# Patient Record
Sex: Female | Born: 1990 | Hispanic: No | Smoking: Never smoker
Health system: Southern US, Community
[De-identification: ages and names within clinical notes are randomized; demographics above are authoritative.]

## PROBLEM LIST (undated history)

## (undated) ENCOUNTER — Inpatient Hospital Stay: Payer: Self-pay

## (undated) DIAGNOSIS — D509 Iron deficiency anemia, unspecified: Secondary | ICD-10-CM

## (undated) DIAGNOSIS — J309 Allergic rhinitis, unspecified: Secondary | ICD-10-CM

## (undated) DIAGNOSIS — N83209 Unspecified ovarian cyst, unspecified side: Secondary | ICD-10-CM

## (undated) HISTORY — PX: CHOLECYSTECTOMY: SHX55

## (undated) HISTORY — DX: Unspecified ovarian cyst, unspecified side: N83.209

## (undated) HISTORY — PX: OTHER SURGICAL HISTORY: SHX169

---
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
## 2009-12-25 ENCOUNTER — Emergency Department: Payer: Self-pay | Admitting: Emergency Medicine

## 2010-01-29 ENCOUNTER — Emergency Department: Payer: Self-pay | Admitting: Emergency Medicine

## 2010-06-26 ENCOUNTER — Ambulatory Visit: Payer: Self-pay | Admitting: Family Medicine

## 2010-08-06 ENCOUNTER — Observation Stay: Payer: Self-pay | Admitting: Obstetrics and Gynecology

## 2010-08-13 ENCOUNTER — Observation Stay: Payer: Self-pay | Admitting: Obstetrics and Gynecology

## 2010-09-01 ENCOUNTER — Inpatient Hospital Stay: Payer: Self-pay | Admitting: Obstetrics and Gynecology

## 2011-03-19 ENCOUNTER — Encounter (HOSPITAL_COMMUNITY): Payer: Self-pay | Admitting: Radiology

## 2011-03-19 ENCOUNTER — Emergency Department (HOSPITAL_COMMUNITY): Payer: No Typology Code available for payment source

## 2011-03-19 ENCOUNTER — Emergency Department: Payer: Self-pay | Admitting: Emergency Medicine

## 2011-03-19 ENCOUNTER — Observation Stay (HOSPITAL_COMMUNITY)
Admission: EM | Admit: 2011-03-19 | Discharge: 2011-03-21 | Disposition: A | Discharge status: Home / Self Care | Payer: No Typology Code available for payment source | Attending: General Surgery | Admitting: General Surgery

## 2011-03-19 DIAGNOSIS — S2020XA Contusion of thorax, unspecified, initial encounter: Secondary | ICD-10-CM | POA: Insufficient documentation

## 2011-03-19 DIAGNOSIS — R51 Headache: Secondary | ICD-10-CM | POA: Insufficient documentation

## 2011-03-19 DIAGNOSIS — Y92410 Unspecified street and highway as the place of occurrence of the external cause: Secondary | ICD-10-CM | POA: Insufficient documentation

## 2011-03-19 DIAGNOSIS — R0989 Other specified symptoms and signs involving the circulatory and respiratory systems: Secondary | ICD-10-CM | POA: Insufficient documentation

## 2011-03-19 DIAGNOSIS — J45909 Unspecified asthma, uncomplicated: Secondary | ICD-10-CM | POA: Insufficient documentation

## 2011-03-19 DIAGNOSIS — R109 Unspecified abdominal pain: Secondary | ICD-10-CM | POA: Insufficient documentation

## 2011-03-19 DIAGNOSIS — M542 Cervicalgia: Secondary | ICD-10-CM | POA: Insufficient documentation

## 2011-03-19 DIAGNOSIS — S7000XA Contusion of unspecified hip, initial encounter: Principal | ICD-10-CM | POA: Insufficient documentation

## 2011-03-19 DIAGNOSIS — R079 Chest pain, unspecified: Secondary | ICD-10-CM | POA: Insufficient documentation

## 2011-03-19 DIAGNOSIS — R0609 Other forms of dyspnea: Secondary | ICD-10-CM | POA: Insufficient documentation

## 2011-03-19 DIAGNOSIS — G930 Cerebral cysts: Secondary | ICD-10-CM | POA: Insufficient documentation

## 2011-03-19 LAB — COMPREHENSIVE METABOLIC PANEL
ALT: 50 U/L — ABNORMAL HIGH (ref 0–35)
Alkaline Phosphatase: 81 U/L (ref 39–117)
CO2: 21 mEq/L (ref 19–32)
Chloride: 106 mEq/L (ref 96–112)
GFR calc non Af Amer: >60 mL/min (ref >60)
Glucose, Bld: 102 mg/dL — ABNORMAL HIGH (ref 70–99)
Potassium: 3.5 mEq/L (ref 3.5–5.1)
Sodium: 139 mEq/L (ref 135–145)
Total Bilirubin: 0.3 mg/dL (ref 0.3–1.2)

## 2011-03-19 LAB — CBC
HCT: 40 % (ref 36.0–46.0)
MCHC: 33 g/dL (ref 30.0–36.0)
MCV: 80.5 fL (ref 78.0–100.0)
RDW: 13.6 % (ref 11.5–15.5)

## 2011-03-19 LAB — ABO/RH: ABO/RH(D): O POS

## 2011-03-19 MED ORDER — IOHEXOL 300 MG/ML  SOLN
80.0000 mL | Freq: Once | INTRAMUSCULAR | Status: AC | PRN
  Administered 2011-03-19: 80 mL via INTRAVENOUS

## 2011-03-20 ENCOUNTER — Observation Stay (HOSPITAL_COMMUNITY): Payer: No Typology Code available for payment source

## 2011-03-20 LAB — CBC
MCH: 26.8 pg (ref 26.0–34.0)
MCV: 81.1 fL (ref 78.0–100.0)
Platelets: 306 10*3/uL (ref 150–400)
RBC: 4.55 MIL/uL (ref 3.87–5.11)
RDW: 13.8 % (ref 11.5–15.5)

## 2011-03-20 LAB — BASIC METABOLIC PANEL
BUN: 8 mg/dL (ref 6–23)
Chloride: 106 mEq/L (ref 96–112)
Creatinine, Ser: 0.5 mg/dL (ref 0.4–1.2)

## 2011-03-21 LAB — BASIC METABOLIC PANEL
CO2: 23 mEq/L (ref 19–32)
Calcium: 9.1 mg/dL (ref 8.4–10.5)
Creatinine, Ser: <0.47 mg/dL (ref 0.4–1.2)
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
## 2011-07-01 ENCOUNTER — Emergency Department: Payer: Self-pay | Admitting: Emergency Medicine

## 2011-07-06 ENCOUNTER — Telehealth (INDEPENDENT_AMBULATORY_CARE_PROVIDER_SITE_OTHER): Payer: Self-pay | Admitting: Orthopedic Surgery
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
## 2012-11-21 ENCOUNTER — Emergency Department: Payer: Self-pay | Admitting: Emergency Medicine

## 2012-11-21 LAB — CBC
HGB: 14 g/dL (ref 12.0–16.0)
MCH: 28.9 pg (ref 26.0–34.0)
MCHC: 34.9 g/dL (ref 32.0–36.0)
MCV: 83 fL (ref 80–100)
Platelet: 344 10*3/uL (ref 150–440)
RBC: 4.83 10*6/uL (ref 3.80–5.20)
RDW: 14.2 % (ref 11.5–14.5)
WBC: 16.9 10*3/uL — ABNORMAL HIGH (ref 3.6–11.0)

## 2012-11-21 LAB — COMPREHENSIVE METABOLIC PANEL
Albumin: 4 g/dL (ref 3.4–5.0)
Anion Gap: 7 (ref 7–16)
BUN: 9 mg/dL (ref 7–18)
Creatinine: 0.65 mg/dL (ref 0.60–1.30)
EGFR (African American): >60
EGFR (Non-African Amer.): >60
Glucose: 93 mg/dL (ref 65–99)
Osmolality: 274 (ref 275–301)
Potassium: 3.4 mmol/L — ABNORMAL LOW (ref 3.5–5.1)
SGPT (ALT): 28 U/L (ref 12–78)
Sodium: 138 mmol/L (ref 136–145)
Total Protein: 8.2 g/dL (ref 6.4–8.2)

## 2012-11-21 LAB — URINALYSIS, COMPLETE
Bacteria: NONE SEEN
Bilirubin,UR: NEGATIVE
Glucose,UR: NEGATIVE mg/dL (ref 0–75)
Protein: NEGATIVE
RBC,UR: 3 /HPF (ref 0–5)
Specific Gravity: 1.028 (ref 1.003–1.030)
Squamous Epithelial: 66
WBC UR: 16 /HPF (ref 0–5)

## 2012-11-21 LAB — LIPASE, BLOOD: Lipase: 107 U/L (ref 73–393)

## 2013-02-04 ENCOUNTER — Emergency Department: Payer: Self-pay | Admitting: Emergency Medicine

## 2013-02-04 LAB — URINALYSIS, COMPLETE
Glucose,UR: NEGATIVE mg/dL (ref 0–75)
Nitrite: NEGATIVE
Ph: 5 (ref 4.5–8.0)
Protein: NEGATIVE
Specific Gravity: 1.02 (ref 1.003–1.030)

## 2013-02-04 LAB — COMPREHENSIVE METABOLIC PANEL
Alkaline Phosphatase: 83 U/L (ref 50–136)
Anion Gap: 3 — ABNORMAL LOW (ref 7–16)
BUN: 10 mg/dL (ref 7–18)
Bilirubin,Total: 0.3 mg/dL (ref 0.2–1.0)
Chloride: 107 mmol/L (ref 98–107)
Co2: 29 mmol/L (ref 21–32)
Creatinine: 0.84 mg/dL (ref 0.60–1.30)
Glucose: 93 mg/dL (ref 65–99)
Osmolality: 276 (ref 275–301)
Potassium: 3.4 mmol/L — ABNORMAL LOW (ref 3.5–5.1)
SGOT(AST): 19 U/L (ref 15–37)
SGPT (ALT): 25 U/L (ref 12–78)
Sodium: 139 mmol/L (ref 136–145)

## 2013-02-04 LAB — CBC
HCT: 37.2 % (ref 35.0–47.0)
RBC: 4.52 10*6/uL (ref 3.80–5.20)
RDW: 13.5 % (ref 11.5–14.5)

## 2013-02-04 LAB — PREGNANCY, URINE: Pregnancy Test, Urine: NEGATIVE m[IU]/mL

## 2013-04-23 ENCOUNTER — Emergency Department: Payer: Self-pay | Admitting: Emergency Medicine

## 2013-06-12 ENCOUNTER — Emergency Department: Payer: Self-pay | Admitting: Emergency Medicine

## 2013-06-12 LAB — BASIC METABOLIC PANEL
BUN: 11 mg/dL (ref 7–18)
Chloride: 104 mmol/L (ref 98–107)
Creatinine: 0.63 mg/dL (ref 0.60–1.30)
EGFR (African American): >60
Glucose: 92 mg/dL (ref 65–99)
Osmolality: 271 (ref 275–301)
Sodium: 136 mmol/L (ref 136–145)

## 2013-06-12 LAB — WET PREP, GENITAL

## 2013-06-12 LAB — URINALYSIS, COMPLETE
Bilirubin,UR: NEGATIVE
Blood: NEGATIVE
Ketone: NEGATIVE
Nitrite: NEGATIVE
Protein: NEGATIVE
RBC,UR: 1 /HPF (ref 0–5)
Specific Gravity: 1.028 (ref 1.003–1.030)
WBC UR: 2 /HPF (ref 0–5)

## 2013-06-12 LAB — CBC
MCV: 81 fL (ref 80–100)
Platelet: 396 10*3/uL (ref 150–440)
RBC: 4.65 10*6/uL (ref 3.80–5.20)

## 2013-06-12 LAB — HCG, QUANTITATIVE, PREGNANCY: Beta Hcg, Quant.: 12704 m[IU]/mL — ABNORMAL HIGH

## 2013-07-21 ENCOUNTER — Emergency Department: Payer: Self-pay | Admitting: Emergency Medicine

## 2013-07-21 LAB — URINALYSIS, COMPLETE
Glucose,UR: NEGATIVE mg/dL (ref 0–75)
Nitrite: NEGATIVE
Ph: 5 (ref 4.5–8.0)
Protein: 30
RBC,UR: 3 /HPF (ref 0–5)
Squamous Epithelial: 7
WBC UR: 5 /HPF (ref 0–5)

## 2013-07-21 LAB — BASIC METABOLIC PANEL
Chloride: 105 mmol/L (ref 98–107)
Co2: 25 mmol/L (ref 21–32)
Creatinine: 0.59 mg/dL — ABNORMAL LOW (ref 0.60–1.30)
EGFR (Non-African Amer.): >60
Glucose: 81 mg/dL (ref 65–99)
Osmolality: 269 (ref 275–301)

## 2013-07-21 LAB — CBC
MCH: 27.6 pg (ref 26.0–34.0)
MCV: 81 fL (ref 80–100)
RDW: 14 % (ref 11.5–14.5)

## 2013-07-21 LAB — HCG, QUANTITATIVE, PREGNANCY: Beta Hcg, Quant.: 82932 m[IU]/mL — ABNORMAL HIGH

## 2013-07-22 LAB — WET PREP, GENITAL

## 2013-08-09 ENCOUNTER — Emergency Department: Payer: Self-pay | Admitting: Emergency Medicine

## 2013-08-09 LAB — COMPREHENSIVE METABOLIC PANEL
Alkaline Phosphatase: 54 U/L (ref 50–136)
BUN: 3 mg/dL — ABNORMAL LOW (ref 7–18)
Chloride: 103 mmol/L (ref 98–107)
Co2: 26 mmol/L (ref 21–32)
SGPT (ALT): 15 U/L (ref 12–78)
Sodium: 134 mmol/L — ABNORMAL LOW (ref 136–145)
Total Protein: 7.1 g/dL (ref 6.4–8.2)

## 2013-08-09 LAB — URINALYSIS, COMPLETE
Bilirubin,UR: NEGATIVE
Blood: NEGATIVE
Glucose,UR: NEGATIVE mg/dL (ref 0–75)
Nitrite: NEGATIVE
Ph: 5 (ref 4.5–8.0)
Protein: NEGATIVE
Specific Gravity: 1.018 (ref 1.003–1.030)
Squamous Epithelial: 33
WBC UR: 15 /HPF (ref 0–5)

## 2013-08-09 LAB — CBC
HGB: 12.1 g/dL (ref 12.0–16.0)
MCH: 28 pg (ref 26.0–34.0)
RDW: 14 % (ref 11.5–14.5)

## 2013-08-09 LAB — LIPASE, BLOOD: Lipase: 111 U/L (ref 73–393)

## 2013-08-10 LAB — WET PREP, GENITAL

## 2013-08-11 LAB — URINE CULTURE

## 2013-08-27 ENCOUNTER — Encounter: Payer: Self-pay | Admitting: Family Medicine

## 2013-09-15 ENCOUNTER — Encounter: Payer: Self-pay | Admitting: Family Medicine

## 2013-11-12 ENCOUNTER — Observation Stay: Payer: Self-pay

## 2013-12-08 ENCOUNTER — Observation Stay: Payer: Self-pay

## 2013-12-08 LAB — COMPREHENSIVE METABOLIC PANEL
ALBUMIN: 2.4 g/dL — AB (ref 3.4–5.0)
ALK PHOS: 104 U/L
Anion Gap: 5 — ABNORMAL LOW (ref 7–16)
BUN: 7 mg/dL (ref 7–18)
Bilirubin,Total: 0.2 mg/dL (ref 0.2–1.0)
CREATININE: 0.48 mg/dL — AB (ref 0.60–1.30)
Calcium, Total: 8.1 mg/dL — ABNORMAL LOW (ref 8.5–10.1)
Chloride: 105 mmol/L (ref 98–107)
Co2: 25 mmol/L (ref 21–32)
EGFR (African American): >60
EGFR (Non-African Amer.): >60
GLUCOSE: 83 mg/dL (ref 65–99)
OSMOLALITY: 267 (ref 275–301)
Potassium: 3.1 mmol/L — ABNORMAL LOW (ref 3.5–5.1)
SGOT(AST): 16 U/L (ref 15–37)
SGPT (ALT): 12 U/L (ref 12–78)
SODIUM: 135 mmol/L — AB (ref 136–145)
Total Protein: 6.2 g/dL — ABNORMAL LOW (ref 6.4–8.2)

## 2013-12-08 LAB — CBC WITH DIFFERENTIAL/PLATELET
BASOS ABS: 0.1 10*3/uL (ref 0.0–0.1)
Basophil %: 0.7 %
EOS PCT: 4 %
Eosinophil #: 0.4 10*3/uL (ref 0.0–0.7)
HCT: 31.3 % — ABNORMAL LOW (ref 35.0–47.0)
HGB: 10.4 g/dL — ABNORMAL LOW (ref 12.0–16.0)
LYMPHS ABS: 2.1 10*3/uL (ref 1.0–3.6)
LYMPHS PCT: 20.5 %
MCH: 27.5 pg (ref 26.0–34.0)
MCHC: 33.3 g/dL (ref 32.0–36.0)
MCV: 83 fL (ref 80–100)
MONO ABS: 1 x10 3/mm — AB (ref 0.2–0.9)
MONOS PCT: 9.6 %
Neutrophil #: 6.6 10*3/uL — ABNORMAL HIGH (ref 1.4–6.5)
Neutrophil %: 65.2 %
Platelet: 312 10*3/uL (ref 150–440)
RBC: 3.78 10*6/uL — AB (ref 3.80–5.20)
RDW: 13.6 % (ref 11.5–14.5)
WBC: 10.1 10*3/uL (ref 3.6–11.0)

## 2013-12-08 LAB — LIPASE, BLOOD: LIPASE: 149 U/L (ref 73–393)

## 2014-02-12 ENCOUNTER — Inpatient Hospital Stay: Payer: Self-pay

## 2014-02-12 LAB — CBC WITH DIFFERENTIAL/PLATELET
Basophil #: 0.1 10*3/uL (ref 0.0–0.1)
Basophil %: 0.5 %
EOS ABS: 0.4 10*3/uL (ref 0.0–0.7)
EOS PCT: 3.9 %
HCT: 34.5 % — ABNORMAL LOW (ref 35.0–47.0)
HGB: 11.2 g/dL — AB (ref 12.0–16.0)
Lymphocyte #: 1.9 10*3/uL (ref 1.0–3.6)
Lymphocyte %: 19 %
MCH: 26.9 pg (ref 26.0–34.0)
MCHC: 32.6 g/dL (ref 32.0–36.0)
MCV: 83 fL (ref 80–100)
Monocyte #: 0.8 x10 3/mm (ref 0.2–0.9)
Monocyte %: 8 %
NEUTROS ABS: 6.9 10*3/uL — AB (ref 1.4–6.5)
Neutrophil %: 68.6 %
Platelet: 273 10*3/uL (ref 150–440)
RBC: 4.18 10*6/uL (ref 3.80–5.20)
RDW: 16.1 % — AB (ref 11.5–14.5)
WBC: 10 10*3/uL (ref 3.6–11.0)

## 2014-02-13 LAB — GC/CHLAMYDIA PROBE AMP

## 2014-02-14 LAB — HEMATOCRIT: HCT: 31.2 % — ABNORMAL LOW (ref 35.0–47.0)

## 2014-05-10 ENCOUNTER — Emergency Department: Payer: Self-pay | Admitting: Emergency Medicine

## 2014-08-06 ENCOUNTER — Emergency Department: Payer: Self-pay | Admitting: Emergency Medicine

## 2014-08-06 LAB — URINALYSIS, COMPLETE
Bilirubin,UR: NEGATIVE
Blood: NEGATIVE
GLUCOSE, UR: NEGATIVE mg/dL (ref 0–75)
KETONE: NEGATIVE
Nitrite: NEGATIVE
Ph: 5 (ref 4.5–8.0)
Protein: NEGATIVE
RBC,UR: 7 /HPF (ref 0–5)
Specific Gravity: 1.015 (ref 1.003–1.030)
Squamous Epithelial: <1

## 2014-08-19 ENCOUNTER — Emergency Department: Payer: Self-pay | Admitting: Emergency Medicine

## 2014-08-19 LAB — BASIC METABOLIC PANEL
ANION GAP: 6 — AB (ref 7–16)
BUN: 10 mg/dL (ref 7–18)
CREATININE: 0.63 mg/dL (ref 0.60–1.30)
Calcium, Total: 8.7 mg/dL (ref 8.5–10.1)
Chloride: 107 mmol/L (ref 98–107)
Co2: 26 mmol/L (ref 21–32)
EGFR (Non-African Amer.): >60
Glucose: 78 mg/dL (ref 65–99)
Osmolality: 275 (ref 275–301)
POTASSIUM: 3.3 mmol/L — AB (ref 3.5–5.1)
Sodium: 139 mmol/L (ref 136–145)

## 2014-08-19 LAB — CBC
HCT: 38.2 % (ref 35.0–47.0)
HGB: 12.6 g/dL (ref 12.0–16.0)
MCH: 27.8 pg (ref 26.0–34.0)
MCHC: 33.1 g/dL (ref 32.0–36.0)
MCV: 84 fL (ref 80–100)
PLATELETS: 322 10*3/uL (ref 150–440)
RBC: 4.55 10*6/uL (ref 3.80–5.20)
RDW: 13.4 % (ref 11.5–14.5)
WBC: 8.6 10*3/uL (ref 3.6–11.0)

## 2014-08-19 LAB — URINALYSIS, COMPLETE
BILIRUBIN, UR: NEGATIVE
Bacteria: NONE SEEN
Glucose,UR: NEGATIVE mg/dL (ref 0–75)
Ketone: NEGATIVE
Nitrite: NEGATIVE
Ph: 5 (ref 4.5–8.0)
Protein: NEGATIVE
RBC,UR: 13 /HPF (ref 0–5)
Specific Gravity: 1.02 (ref 1.003–1.030)
Transitional Epi: 3

## 2014-08-19 LAB — TROPONIN I

## 2014-08-19 LAB — D-DIMER(ARMC): D-Dimer: 199 ng/ml

## 2014-10-08 ENCOUNTER — Emergency Department: Payer: Self-pay | Admitting: Emergency Medicine

## 2014-10-08 LAB — URINALYSIS, COMPLETE
BILIRUBIN, UR: NEGATIVE
Blood: NEGATIVE
Glucose,UR: NEGATIVE mg/dL (ref 0–75)
Ketone: NEGATIVE
Nitrite: NEGATIVE
PROTEIN: NEGATIVE
Ph: 5 (ref 4.5–8.0)
RBC,UR: 11 /HPF (ref 0–5)
Specific Gravity: 1.028 (ref 1.003–1.030)
Squamous Epithelial: 7
WBC UR: 60 /HPF (ref 0–5)

## 2014-10-28 DIAGNOSIS — D649 Anemia, unspecified: Secondary | ICD-10-CM | POA: Insufficient documentation

## 2014-10-28 DIAGNOSIS — R0789 Other chest pain: Secondary | ICD-10-CM | POA: Insufficient documentation

## 2014-10-28 DIAGNOSIS — R0600 Dyspnea, unspecified: Secondary | ICD-10-CM | POA: Insufficient documentation

## 2014-10-28 DIAGNOSIS — Z8742 Personal history of other diseases of the female genital tract: Secondary | ICD-10-CM | POA: Insufficient documentation

## 2015-02-23 ENCOUNTER — Emergency Department
Admit: 2015-02-23 | Disposition: A | Discharge status: Home / Self Care | Payer: Self-pay | Admitting: Emergency Medicine

## 2015-02-23 LAB — URINALYSIS, COMPLETE
Bilirubin,UR: NEGATIVE
Glucose,UR: NEGATIVE mg/dL (ref 0–75)
Nitrite: NEGATIVE
PH: 5 (ref 4.5–8.0)
Protein: NEGATIVE
Specific Gravity: 1.02 (ref 1.003–1.030)

## 2015-02-23 LAB — BASIC METABOLIC PANEL
ANION GAP: 8 (ref 7–16)
BUN: 11 mg/dL
CALCIUM: 9.1 mg/dL
CREATININE: 0.57 mg/dL
Chloride: 104 mmol/L
Co2: 25 mmol/L
Glucose: 107 mg/dL — ABNORMAL HIGH
Potassium: 3.7 mmol/L
SODIUM: 137 mmol/L

## 2015-02-23 LAB — CBC
HCT: 38.4 % (ref 35.0–47.0)
HGB: 12.8 g/dL (ref 12.0–16.0)
MCH: 27.5 pg (ref 26.0–34.0)
MCHC: 33.2 g/dL (ref 32.0–36.0)
MCV: 83 fL (ref 80–100)
Platelet: 336 10*3/uL (ref 150–440)
RBC: 4.64 10*6/uL (ref 3.80–5.20)
RDW: 13.9 % (ref 11.5–14.5)
WBC: 11 10*3/uL (ref 3.6–11.0)

## 2015-03-25 NOTE — H&P (Signed)
L&D Evaluation:  History:  HPI 24 year old G3 p2002 with EDC=02/05/2013 by a 6 week ultrasound presents at 31.4 wks with c/o right sided abdominal pain. The pain was first noted in RUQ at 9 PM last night and resolved 1-2 hours later after having a BM. The pain was worse when she took a deep breath. She had just eaten a tortilla stuffed with meat an hour before the pain started. She then had the pain recur at 3:30 PM today and it has been constant since then. The pain is not accompanied by nausea and did not seen to be affected by eating supper. Denies diarrhea. C/O daily "hard" BMs sometimes accompanied by bleeding and pain at the rectum. Had chills last night with the pain. Has heartburn and takes Tums prn. The pain is also made worse by lying supine and improves with lying on her left side. Seen in L&D the end of Dec with URI sx and decreased FM. States the cold sx have resolved.  Hx of seasonal allergies and asthma. Currently not taking any antihistimines, but does use Flonase nasal spray and Albuterol prn. PNC at [facility]   Presents with abdominal pain, right sided abdominal pain   Patient's Medical History Asthma  seasonal allergies.   Patient's Surgical History Cholecystectomy 2010   Medications Pre Natal Vitamins  Tylenol (Acetaminophen)  Albuterol inhaler, Flonase nasal spray, Tums   Allergies NKDA   Social History none   Family History Non-Contributory   ROS:  ROS see HPI. Also positive for leg cramps bilaterally   Exam:  Vital Signs stable  101/46   Urine Protein not completed   General no apparent distress   Mental Status clear   Chest clear  Not tachypnic, no labored breathing   Heart normal sinus rhythm, no murmur/gallop/rubs   Abdomen gravid, baby lying on maternal right; tenderness in RUQ< tenderness over right round ligament.   Estimated Fetal Weight Average for gestational age   Fetal Position cephalic   Back no CVAT   Edema no edema   Mebranes Intact    FHT normal rate with no decels, 125 baseline with accels to 140s, mod variability   FHT Description CAT1   Ucx absent   Skin dry   Impression:  Impression reactive NST, IUP at 31 4/7 weeks with right sided abdominal pain   Plan:  Plan DC EFM. CMP, lipase, CBC   Comments WBC=10.1K, liver enzymes and lipase normal. K+=3.1. Given 20 meq KCL po and advised of foods high in K+. Patient reassured that pain most likely a result of baby lying on her right, round ligament pain, and perhaps constipation. Recommend increasing water intake. Can also take magnesium supplements. Will discharge home. FU at [facility] as scheduled in 2 weeks.   Electronic Signatures: Trinna BalloonGutierrez, Polo Mcmartin L (CNM)  (Signed 24-Jan-15 20:30)  Authored: L&D Evaluation   Last Updated: 24-Jan-15 20:30 by Trinna BalloonGutierrez, Wrenley Sayed L (CNM)

## 2015-03-25 NOTE — H&P (Signed)
L&D Evaluation:  History:  HPI 24 year old G3 p2002 with EDC=02/05/2013 by a 6 week ultrasound presents at 27.6 wks with decreased FM today.  She also c/o URI sx since 12/24-nasal/sinus congestion and pressure and mild throat irritation. Has taken one regular strength Tylenol at 1100 today without relief. Hx of seasonal allergies and asthma. Currently not taking any antihistimines, but does use Flonase nasal spray. Has been using her Albuterol inhaler for wheezing and cough that she has been experiencing mostly at night. Maybe experiencing some reflux at night also "feels like everything is inher throat when she lies down". PNC at [facility]   Presents with decreased fetal movement, URI sx.   Patient's Medical History Asthma  seasonal allergies.    Patient's Surgical History Cholecystectomy 2010    Medications Pre Natal Vitamins  Tylenol (Acetaminophen)  Albuterol inhaler, Flonase nasal spray.    Allergies NKDA   Social History none    Family History Non-Contributory    ROS:  ROS see HPI   Exam:  Vital Signs afebrile. 90/50    Urine Protein not completed   General no apparent distress   Mental Status clear    Chest clear    Heart normal sinus rhythm, no murmur/gallop/rubs   Abdomen gravid, non-tender   Edema no edema    Reflexes 1+    Mebranes Intact   FHT 120 with accels to 140s to 150 (multiple audible FMs)   FHT Description Cat 1   Ucx absent   Skin dry   Other Nasal turbinates: right boggy, bluish, swollen and left inflammed and swollen. OP: enlarged, noninflammed tonsils.   Impression:  Impression IUP at 27.6 weeks with reactive NST. URI. Possible reflux   Plan:  Plan DC home on mucinex 600 mgm and Zantac 150 mgm BID prn. FU at [facility] this week as scheduled. [facility]   Electronic Signatures: Trinna BalloonGutierrez, Wong Steadham L (CNM)  (Signed 30-Dec-14 00:32)  Authored: L&D Evaluation   Last Updated: 30-Dec-14 00:32 by Trinna BalloonGutierrez, Evadene Wardrip L (CNM)

## 2015-03-25 NOTE — H&P (Signed)
L&D Evaluation:  History Expanded:  HPI 24 yo G3P2002 at 5841w0d gestational age by 6 week ultrasound presents for Induction of labor due to late term pregnancy.  Pregnancy complicated by asthma, which she rarely need to treat.  She notes occasional contractions.  She notes positive fetal movement, no leakage of fluid, and no vaginal bleeding.  This is a little girl named Abigail.   Gravida 3   Term 2   PreTerm 0   Abortion 0   Living 2   Blood Type (Maternal) O positive   Group B Strep Results Maternal (Result >5wks must be treated as unknown) negative   Maternal HIV Negative   Maternal Syphilis Ab Nonreactive   Maternal Varicella Immune   Rubella Results (Maternal) immune   [facility]   Patient's Medical History Asthma   Patient's Surgical History laparoscopic cholecystectomy   Medications Pre Natal Vitamins  Iron   Allergies Unisom   Social History none   Family History Non-Contributory   ROS:  ROS All systems were reviewed.  HEENT, CNS, GI, GU, Respiratory, CV, Renal and Musculoskeletal systems were found to be normal.   Exam:  Vital Signs Afebrile, normotensive, all other vitals negative   General no apparent distress   Mental Status clear   Chest clear   Heart normal sinus rhythm   Abdomen gravid, non-tender   Estimated Fetal Weight 7 pounds   Fetal Position cephalic by bedside ultrasound   Back no CVAT   Edema no edema   Pelvic no external lesions, 1cm per RN   Mebranes Intact   FHT normal rate with no decels   FHT Description 130/mod var/+accels/no decels   Ucx regular, 1-2  q 10 min (roughly q 6)   Skin no lesions   Impression:  Impression 1) Intrauterine pregnancy at 7541 weeks gestational age , 2) Induction of labor for late term   Plan:  Comments 1) Labor: start with cervadil  2) Fetus - category I tracing  3) PNL O positive / ABSC negative / RI / VZI / HIV neg / RPR NR / HBsAg neg / SS negative/  1-hr OGTT 80 / GBS  negative (01/09/14)  4) TDAP given 11/16/13  5) Disposition - home postpartum  Pt has been fully informed of the pros and cons, risk/benefits continued observation with NST/AFI versus the risks of induction.  She understands that there is an increasing risk to the fetus with continued intrauterine existence after [redacted] weeks gestation.  She also understands that there are uncommon risks to induction, which include but are not limited to:  frequent and/or prolonged uterine contractions, fetal distress, uterine rupture and lack of success of induction.  These risks involve all methods of induction whether Pitocin or Misoprostol She also has been informed that if the induction is not successful a Cesarean Section may be necessary.   Electronic Signatures: Conard NovakJackson, Esmirna Ravan D (MD)  (Signed 31-Mar-15 22:48)  Authored: L&D Evaluation   Last Updated: 31-Mar-15 22:48 by Conard NovakJackson, Suraiya Dickerson D (MD)

## 2015-07-17 DIAGNOSIS — IMO0001 Reserved for inherently not codable concepts without codable children: Secondary | ICD-10-CM | POA: Insufficient documentation

## 2015-07-17 DIAGNOSIS — R209 Unspecified disturbances of skin sensation: Secondary | ICD-10-CM

## 2015-08-07 ENCOUNTER — Emergency Department
Admission: EM | Admit: 2015-08-07 | Discharge: 2015-08-08 | Disposition: A | Discharge status: Home / Self Care | Payer: Medicaid Other | Attending: Emergency Medicine | Admitting: Emergency Medicine

## 2015-08-07 ENCOUNTER — Encounter: Payer: Self-pay | Admitting: *Deleted

## 2015-08-07 DIAGNOSIS — Z3202 Encounter for pregnancy test, result negative: Secondary | ICD-10-CM | POA: Diagnosis not present

## 2015-08-07 DIAGNOSIS — N10 Acute tubulo-interstitial nephritis: Secondary | ICD-10-CM | POA: Diagnosis not present

## 2015-08-07 DIAGNOSIS — R1010 Upper abdominal pain, unspecified: Secondary | ICD-10-CM | POA: Diagnosis present

## 2015-08-07 LAB — URINALYSIS COMPLETE WITH MICROSCOPIC (ARMC ONLY)
Bilirubin Urine: NEGATIVE
Glucose, UA: NEGATIVE mg/dL
Ketones, ur: NEGATIVE mg/dL
Nitrite: NEGATIVE
PH: 6 (ref 5.0–8.0)
PROTEIN: NEGATIVE mg/dL
Specific Gravity, Urine: 1.006 (ref 1.005–1.030)

## 2015-08-07 LAB — CBC WITH DIFFERENTIAL/PLATELET
BASOS ABS: 0.1 10*3/uL (ref 0–0.1)
Basophils Relative: 1 %
Eosinophils Absolute: 0.4 10*3/uL (ref 0–0.7)
Eosinophils Relative: 4 %
HEMATOCRIT: 41 % (ref 35.0–47.0)
Hemoglobin: 13.8 g/dL (ref 12.0–16.0)
LYMPHS PCT: 23 %
Lymphs Abs: 2.6 10*3/uL (ref 1.0–3.6)
MCH: 27.8 pg (ref 26.0–34.0)
MCHC: 33.6 g/dL (ref 32.0–36.0)
MCV: 82.8 fL (ref 80.0–100.0)
MONO ABS: 0.7 10*3/uL (ref 0.2–0.9)
MONOS PCT: 6 %
NEUTROS ABS: 7.6 10*3/uL — AB (ref 1.4–6.5)
Neutrophils Relative %: 66 %
Platelets: 353 10*3/uL (ref 150–440)
RBC: 4.95 MIL/uL (ref 3.80–5.20)
RDW: 14.4 % (ref 11.5–14.5)
WBC: 11.5 10*3/uL — ABNORMAL HIGH (ref 3.6–11.0)

## 2015-08-07 LAB — BASIC METABOLIC PANEL
ANION GAP: 7 (ref 5–15)
BUN: 12 mg/dL (ref 6–20)
CALCIUM: 9.2 mg/dL (ref 8.9–10.3)
CHLORIDE: 105 mmol/L (ref 101–111)
CO2: 25 mmol/L (ref 22–32)
Creatinine, Ser: 0.72 mg/dL (ref 0.44–1.00)
GFR calc Af Amer: >60 mL/min (ref >60)
GFR calc non Af Amer: >60 mL/min (ref >60)
GLUCOSE: 94 mg/dL (ref 65–99)
Potassium: 3.9 mmol/L (ref 3.5–5.1)
Sodium: 137 mmol/L (ref 135–145)

## 2015-08-07 LAB — POCT PREGNANCY, URINE: PREG TEST UR: NEGATIVE

## 2015-08-07 NOTE — ED Notes (Signed)
poct pregnancy Negative 

## 2015-08-07 NOTE — ED Notes (Signed)
Pt has upper abd pain.  States goes into mid back.  No n/v/d.  Pain began this morning and worsened tonight.   Took otc meds without relief today.

## 2015-08-08 MED ORDER — CIPROFLOXACIN HCL 500 MG PO TABS
500.0000 mg | ORAL_TABLET | Freq: Once | ORAL | Status: AC
  Administered 2015-08-08: 500 mg via ORAL
  Filled 2015-08-08: qty 1

## 2015-08-08 MED ORDER — CIPROFLOXACIN HCL 500 MG PO TABS: 500.0000 mg | ORAL_TABLET | Freq: Two times a day (BID) | ORAL | Status: DC

## 2015-08-08 NOTE — ED Provider Notes (Signed)
[facility] Emergency Department Provider Note  ____________________________________________  Time seen: 12:15 AM  I have reviewed the triage vital signs and the nursing notes.   HISTORY  Chief Complaint Abdominal Pain      HPI Courtney Sanders is a 24 y.o. female presents with upper abdominal pain radiating to her back, dysuria and nausea. Patient denies any fever. Attempt on arrival to the emergency department 99.2 oral. Current pain score 5 out of 10    Past medical history None There are no active problems to display for this patient.   Past surgical history Cholecystectomy No current outpatient prescriptions on file.  Allergies No known drug allergies No family history on file.  Social History Social History  Substance Use Topics  . Smoking status: Never Smoker   . Smokeless tobacco: Not on file  . Alcohol Use: No    Review of Systems  Constitutional: Negative for fever. Eyes: Negative for visual changes. ENT: Negative for sore throat. Cardiovascular: Negative for chest pain. Respiratory: Negative for shortness of breath. Gastrointestinal: Positive for abdominal pain, negative for vomiting and diarrhea. Genitourinary: Negative for dysuria. Musculoskeletal: Negative for back pain. Skin: Negative for rash. Neurological: Negative for headaches, focal weakness or numbness.   10-point ROS otherwise negative.  ____________________________________________   PHYSICAL EXAM:  VITAL SIGNS: ED Triage Vitals  Enc Vitals Group     BP 08/07/15 2046 118/66 mmHg     Pulse Rate 08/07/15 2046 84     Resp 08/07/15 2046 18     Temp 08/07/15 2046 99.2 F (37.3 C)     Temp Source 08/07/15 2046 Oral     SpO2 08/07/15 2046 98 %     Weight 08/07/15 2046 190 lb (86.183 kg)     Height 08/07/15 2046  (1.575 m)     Head Cir --      Peak Flow --      Pain Score 08/07/15 2047 10     Pain Loc --      Pain Edu? --    Excl. in GC? --      Constitutional: Alert and oriented. Well appearing and in no distress. Eyes: Conjunctivae are normal. PERRL. Normal extraocular movements. ENT   Head: Normocephalic and atraumatic.   Nose: No congestion/rhinnorhea.   Mouth/Throat: Mucous membranes are moist.   Neck: No stridor. Cardiovascular: Normal rate, regular rhythm. Normal and symmetric distal pulses are present in all extremities. No murmurs, rubs, or gallops. Respiratory: Normal respiratory effort without tachypnea nor retractions. Breath sounds are clear and equal bilaterally. No wheezes/rales/rhonchi. Gastrointestinal: Soft and nontender. No distention. Left CVA tenderness Genitourinary: deferred Musculoskeletal: Nontender with normal range of motion in all extremities. No joint effusions.  No lower extremity tenderness nor edema. Neurologic:  Normal speech and language. No gross focal neurologic deficits are appreciated. Speech is normal.  Skin:  Skin is warm, dry and intact. No rash noted. Psychiatric: Mood and affect are normal. Speech and behavior are normal. Patient exhibits appropriate insight and judgment.  ____________________________________________    LABS (pertinent positives/negatives)  Labs Reviewed  CBC WITH DIFFERENTIAL/PLATELET - Abnormal; Notable for the following:    WBC 11.5 (*)    Neutro Abs 7.6 (*)    All other components within normal limits  URINALYSIS COMPLETEWITH MICROSCOPIC ([facility] ONLY) - Abnormal; Notable for the following:    Color, Urine STRAW (*)    APPearance HAZY (*)    Hgb urine dipstick 2+ (*)    Leukocytes, UA  3+ (*)    Bacteria, UA RARE (*)    Squamous Epithelial / LPF 0-5 (*)    All other components within normal limits  BASIC METABOLIC PANEL  POC URINE PREG, ED  POCT PREGNANCY, URINE       INITIAL IMPRESSION / ASSESSMENT AND PLAN / ED COURSE  Pertinent labs & imaging results that were available during my care of the patient were reviewed  by me and considered in my medical decision making (see chart for details).  History physical exam, laboratory data consistent with acute pyelonephritis.  ____________________________________________   FINAL CLINICAL IMPRESSION(S) / ED DIAGNOSES  Final diagnoses:  Acute pyelonephritis      Darci Current, MD 08/08/15 [phone]

## 2015-08-08 NOTE — Discharge Instructions (Signed)
Pielonefritis - Adultos  °(Pyelonephritis, Adult) ° La pielonefritis es una infección del riñón. Hay dos tipos principales de pielonefritis:  °· Una infección que se inicia rápidamente sin síntomas previos (pielonefritis aguda). °· Infecciones que persisten por un largo período (pielonefritis crónica). °CAUSAS  °Hay dos causas principales:  °· Pasaje de bacterias desde la vejiga al riñón. Este problema aparece especialmente en mujeres embarazadas. La orina en la vejiga puede infectarse por diferentes causas, por ejemplo: °¨ Inflamación de la próstata (prostatitis). °¨ Durante las relaciones sexuales en las mujeres. °¨ Infección en la vejiga (cistitis). °· Pasaje de bacterias desde la sangre hacia el riñón. °Las enfermedades que aumentan el riesgo son:  °· Diabetes. °· Cálculos renales o en la vesícula. °· Cáncer. °· Un catéter colocado en la vejiga. °· Otras anormalidades del riñón o de la uretra. °SÍNTOMAS  °· Dolor abdominal °· Dolor en la zona del costado o flanco. °· Fiebre. °· Escalofríos. °· Malestar estomacal. °· Sangre en la orina (orina oscura). °· Necesidad frecuente de orinar °· Necesidad intensa o persistente de orinar. °· Sensación de ardor o pinchazos al orinar. °DIAGNÓSTICO  °El médico diagnosticará una infección en su riñón basándose en los síntomas. También tomará una muestra de orina.  °TRATAMIENTO  °Generalmente el tratamiento depende de la gravedad de la infección.  °· Si la infección es leve y se diagnostica a tiempo, el médico lo tratará con antibióticos por vía oral y lo dejará irse a su casa. °· Si la infección es más grave, la bacteria podría haber ingresado al torrente sanguíneo. Esto requerirá antibióticos por vía intravenosa y la permanencia en el hospital. Los síntomas pueden incluir: °¨ Fiebre alta. °¨ Dolor intenso en un costado del cuerpo. °¨ Escalofríos °· Aún después de haber permanecido en el hospital, el médico podrá indicarle antibióticos por vía oral durante cierto período de  tiempo. °· Podrá prescribirle otros tratamientos según la causa de la infección. °INSTRUCCIONES PARA EL CUIDADO EN EL HOGAR  °· Tome los antibióticos como se le indicó. Tómelos todos, aunque se sienta mejor. °· Concurra para realizar un control y asegurarse de que la infección ha desaparecido. °· Debe ingerir gran cantidad de líquido para mantener la orina de tono claro o color amarillo pálido. °· Tome medicamentos para la vejiga si siente urgencia para orinar o lo hace con mucha frecuencia. °SOLICITE ATENCIÓN MÉDICA DE INMEDIATO SI:  °· Tiene fiebre o síntomas persistentes durante más de 2 ó 3 días. °· Tiene fiebre y los síntomas empeoran. °· No puede tomar los antibióticos ni ingerir líquidos. °· Comienza a sentir escalofríos. °· Siente debilidad extrema o se desmaya. °· No mejora después de 2 días de tratamiento. °ASEGÚRESE DE QUE:  °· Comprende estas instrucciones. °· Controlará su enfermedad. °· Solicitará ayuda de inmediato si no mejora o empeora. °Document Released: 08/11/2005 Document Revised: 05/02/2012 °Exit[employer]® Patient Information ©2015 [employer]. This information is not intended to replace advice given to you by your health care provider. Make sure you discuss any questions you have with your health care provider. ° °

## 2015-12-23 ENCOUNTER — Emergency Department
Admission: EM | Admit: 2015-12-23 | Discharge: 2015-12-23 | Disposition: A | Discharge status: Home / Self Care | Payer: Medicaid Other | Attending: Emergency Medicine | Admitting: Emergency Medicine

## 2015-12-23 ENCOUNTER — Encounter: Payer: Self-pay | Admitting: Emergency Medicine

## 2015-12-23 DIAGNOSIS — R109 Unspecified abdominal pain: Secondary | ICD-10-CM | POA: Insufficient documentation

## 2015-12-23 DIAGNOSIS — R1110 Vomiting, unspecified: Secondary | ICD-10-CM | POA: Insufficient documentation

## 2015-12-23 NOTE — ED Notes (Signed)
Pt to ed with c/o vomiting x 4 today, denies diarrhea.  Pt also reports abd pain.

## 2016-03-10 ENCOUNTER — Encounter: Payer: Self-pay | Admitting: Emergency Medicine

## 2016-03-10 ENCOUNTER — Emergency Department
Admission: EM | Admit: 2016-03-10 | Discharge: 2016-03-11 | Disposition: A | Discharge status: Home / Self Care | Payer: Medicaid Other | Attending: Emergency Medicine | Admitting: Emergency Medicine

## 2016-03-10 DIAGNOSIS — M5442 Lumbago with sciatica, left side: Secondary | ICD-10-CM | POA: Diagnosis not present

## 2016-03-10 DIAGNOSIS — J45909 Unspecified asthma, uncomplicated: Secondary | ICD-10-CM | POA: Insufficient documentation

## 2016-03-10 DIAGNOSIS — Z79899 Other long term (current) drug therapy: Secondary | ICD-10-CM | POA: Insufficient documentation

## 2016-03-10 DIAGNOSIS — R1032 Left lower quadrant pain: Secondary | ICD-10-CM | POA: Insufficient documentation

## 2016-03-10 LAB — URINALYSIS COMPLETE WITH MICROSCOPIC (ARMC ONLY)
Bacteria, UA: NONE SEEN
Bilirubin Urine: NEGATIVE
Glucose, UA: NEGATIVE mg/dL
KETONES UR: NEGATIVE mg/dL
LEUKOCYTES UA: NEGATIVE
Nitrite: NEGATIVE
PH: 6 (ref 5.0–8.0)
PROTEIN: NEGATIVE mg/dL
Specific Gravity, Urine: 1.005 (ref 1.005–1.030)

## 2016-03-10 MED ORDER — ONDANSETRON HCL 4 MG/2ML IJ SOLN
4.0000 mg | Freq: Once | INTRAMUSCULAR | Status: AC
  Administered 2016-03-10: 4 mg via INTRAVENOUS
  Filled 2016-03-10: qty 2

## 2016-03-10 MED ORDER — MORPHINE SULFATE (PF) 4 MG/ML IV SOLN
4.0000 mg | Freq: Once | INTRAVENOUS | Status: AC
  Administered 2016-03-10: 4 mg via INTRAVENOUS
  Filled 2016-03-10: qty 1

## 2016-03-10 NOTE — ED Notes (Signed)
Patient ambulatory to triage with steady gait, without difficulty or distress noted; pt reports left side abd pain since yesterday accomp by pelvic pressure

## 2016-03-10 NOTE — ED Notes (Signed)
Pt c/o left lower side pain for the last 24 hours  - the pain radiates all the way down leg and into foot - pt reports that she has pain in her "bladder when she is sitting" - pt c/o nausea but denies vomiting - reports bloated

## 2016-03-10 NOTE — ED Provider Notes (Signed)
[facility] Emergency Department Provider Note  ____________________________________________  Time seen: Approximately 11:05 PM  I have reviewed the triage vital signs and the nursing notes.   HISTORY  Chief Complaint Abdominal Pain    HPI Courtney Sanders is a 25 y.o. female with a past medical history includes only mild well-controlled asthma and obesity who presents with acute onset of gradually worsening bilateral lower back pain that is worse on the left side and radiating all the way down to her foot.  She reports the pain is constant and severe, worse with sitting in certain positions, better with nothing.  She describes as an aching and sometimes sharp and stabbing pain.  It is accompanied with a sensation of numbness and tingling around in her hip and pelvic region and down the back of her left leg.  She states that she also feels a sensation of pressure in her bladder and feels that she has not been able to fully empty her bladder.  She also has been noting some urinary incontinence and when she took off her clothes here in the emergency department she noted that her underwear had some urine in it.She denies any trauma or injury.  She does not work out extensively and has not been lifting heavy objects.  She has not had any bowel symptoms including no diarrhea nor bowel incontinence.  She denies fever/chills, chest pain, shortness of breath, upper abdominal pain, dysuria, headache.  She has some left sided lower abdominal pain but it seems to be coming from her back.   Past Medical History  Diagnosis Date  . Asthma     There are no active problems to display for this patient.   Past Surgical History  Procedure Laterality Date  . Cholecystectomy      Current Outpatient Rx  Name  Route  Sig  Dispense  Refill  . albuterol (PROVENTIL HFA;VENTOLIN HFA) 108 (90 Base) MCG/ACT inhaler   Inhalation   Inhale 2 puffs into the lungs 2  (two) times daily.         . cetirizine (ZYRTEC) 10 MG tablet   Oral   Take 10 mg by mouth daily.         Marland Kitchen ibuprofen (ADVIL,MOTRIN) 200 MG tablet   Oral   Take 400 mg by mouth every 6 (six) hours as needed.         Marland Kitchen HYDROcodone-acetaminophen (NORCO/VICODIN) 5-325 MG tablet   Oral   Take 1-2 tablets by mouth every 4 (four) hours as needed for moderate pain.   15 tablet   0   . ondansetron (ZOFRAN) 4 MG tablet      Take 1-2 tabs by mouth every 8 hours as needed for nausea/vomiting   30 tablet   0     Allergies Unisom  No family history on file.  Social History Social History  Substance Use Topics  . Smoking status: Never Smoker   . Smokeless tobacco: None  . Alcohol Use: Yes    Review of Systems Constitutional: No fever/chills Eyes: No visual changes. ENT: No sore throat. Cardiovascular: Denies chest pain. Respiratory: Denies shortness of breath. Gastrointestinal: No abdominal pain.  No nausea, no vomiting.  No diarrhea.  No constipation. Genitourinary: Negative for dysuria. Sensation of inability to fully empty bladder and bladder pressure.  Some urinary incontinence. Musculoskeletal: Negative for back pain. Skin: Negative for rash. Neurological: Negative for headaches.  Numbness/tingling around L hip and down LLE.  No weakness  10-point ROS otherwise negative.  ____________________________________________   PHYSICAL EXAM:  VITAL SIGNS: ED Triage Vitals  Enc Vitals Group     BP 03/10/16 2228 120/80 mmHg     Pulse Rate 03/10/16 2228 72     Resp 03/10/16 2228 20     Temp 03/10/16 2228 98.1 F (36.7 C)     Temp Source 03/10/16 2228 Oral     SpO2 03/10/16 2228 99 %     Weight 03/10/16 2228 200 lb (90.719 kg)     Height 03/10/16 2228  (1.575 m)     Head Cir --      Peak Flow --      Pain Score 03/10/16 2227 10     Pain Loc --      Pain Edu? --      Excl. in GC? --     Constitutional: Alert and oriented. Well appearing and in appears  to be in pain Eyes: Conjunctivae are normal. PERRL. EOMI. Head: Atraumatic. Nose: No congestion/rhinnorhea. Mouth/Throat: Mucous membranes are moist.  Oropharynx non-erythematous. Neck: No stridor.  No meningeal signs.   Cardiovascular: Normal rate, regular rhythm. Good peripheral circulation. Grossly normal heart sounds.   Respiratory: Normal respiratory effort.  No retractions. Lungs CTAB. Gastrointestinal: Soft With very mild left lower quadrant tenderness that seems to be coming around from the side or back. No distention. Deferred rectal. Musculoskeletal: No lower extremity tenderness nor edema. No gross deformities of extremities.  Severe tenderness to palpation of bilateral lower back (L-spine), L hip.  Reproducible pain with ROM of L hip. Neurologic:  Normal speech and language. No gross focal neurologic deficits are appreciated but patient has subjective decreased light touch sensation on posterior LLE and endorses tingling/numbness on L hip. Skin:  Skin is warm, dry and intact. No rash noted. Psychiatric: Mood and affect are normal. Speech and behavior are normal.  ____________________________________________   LABS (all labs ordered are listed, but only abnormal results are displayed)  Labs Reviewed  URINALYSIS COMPLETEWITH MICROSCOPIC ([facility] ONLY) - Abnormal; Notable for the following:    Color, Urine STRAW (*)    APPearance CLEAR (*)    Hgb urine dipstick 1+ (*)    Squamous Epithelial / LPF 0-5 (*)    All other components within normal limits  POCT PREGNANCY, URINE   ____________________________________________  EKG  None ____________________________________________  RADIOLOGY   Mr Lumbar Spine Wo Contrast  03/11/2016  CLINICAL DATA:  LEFT lower back pain for 1 day, radiating to LEFT foot. Bladder pain with sitting. Urinary retention. EXAM: MRI LUMBAR SPINE WITHOUT CONTRAST TECHNIQUE: Multiplanar, multisequence MR imaging of the lumbar spine was performed. No  intravenous contrast was administered. COMPARISON:  None. FINDINGS: Lumbar vertebral bodies are intact and aligned with maintenance of lumbar lordosis. Intervertebral discs demonstrate normal morphology and signal characteristics. No abnormal bone marrow signal. Conus medullaris terminates at L1-2 and demonstrates normal morphology and signal characteristics. Cauda equina is normal. Included prevertebral and paraspinal soft tissues are normal. Level by level evaluation: T12-L1 through L5-S1: No disc bulge, canal stenosis nor neural foraminal narrowing. IMPRESSION: Normal noncontrast MRI of the lumbar spine. Electronically Signed   By: Awilda Metro M.D.   On: 03/11/2016 00:56    ____________________________________________   PROCEDURES  Procedure(s) performed: None  Critical Care performed: No ____________________________________________   INITIAL IMPRESSION / ASSESSMENT AND PLAN / ED COURSE  Pertinent labs & imaging results that were available during my care of the patient were reviewed by me and considered  in my medical decision making (see chart for details).  11:40 PM.  Symptoms most likely represent left sided sciatica, but the constellation of symptoms is concerning for cauda equina syndrome.  Given the morbidity of a missed diagnosis, I will proceed with MRI L-spine without contrast.  Explained plan to the patient, providing morphine and zofran.  No abd tenderness, Doubt intraabdominal pathology.  Point-of-care urine pregnancy test was negative.  ----------------------------------------- 2:05 AM on 03/11/2016 -----------------------------------------  MRI was normal.  Patient reports improved pain from prior.  I considered doing blood work but it is very unlikely that any labs would elucidate the cause of her pain.  It appears very much musculoskeletal and it is reassuring that her MRI is normal.  It is also possible that she had or perhaps still has a kidney stone on the left  side which is causing both her urinary symptoms and the flank pain.  However she has no sign of infection in her urine and no hematuria.  I do not believe that this young woman would benefit from a CT scan of her abdomen; the risk of extra radiation with a low probability of intra-abdominal pathology outweighs the possible benefit.  The patient is comfortable and has had normal vital signs and is afebrile.  I will prescribe her outpatient analgesia and antiemetics and I gave her strict return precautions if her symptoms worsen.  She understands and agrees with the plan. ____________________________________________  FINAL CLINICAL IMPRESSION(S) / ED DIAGNOSES  Final diagnoses:  Acute bilateral low back pain with left-sided sciatica  LLQ pain      NEW MEDICATIONS STARTED DURING THIS VISIT:  New Prescriptions   HYDROCODONE-ACETAMINOPHEN (NORCO/VICODIN) 5-325 MG TABLET    Take 1-2 tablets by mouth every 4 (four) hours as needed for moderate pain.   ONDANSETRON (ZOFRAN) 4 MG TABLET    Take 1-2 tabs by mouth every 8 hours as needed for nausea/vomiting      Note:  This document was prepared using Dragon voice recognition software and may include unintentional dictation errors.   Loleta Roseory Muhammadali Ries, MD 03/11/16 [phone]

## 2016-03-10 NOTE — ED Notes (Signed)
MRI checklist completed and MRI tech called.

## 2016-03-11 ENCOUNTER — Emergency Department: Payer: Medicaid Other

## 2016-03-11 LAB — POCT PREGNANCY, URINE: Preg Test, Ur: NEGATIVE

## 2016-03-11 MED ORDER — ONDANSETRON HCL 4 MG PO TABS: ORAL_TABLET | ORAL | Status: DC

## 2016-03-11 MED ORDER — HYDROCODONE-ACETAMINOPHEN 5-325 MG PO TABS: 1.0000 | ORAL_TABLET | ORAL | Status: DC | PRN

## 2016-03-11 NOTE — ED Notes (Signed)
Patient transported to MRI 

## 2016-03-11 NOTE — Discharge Instructions (Signed)
As we discussed, your workup was generally reassuring tonight.  There is no sign of infection nor blood in your urine, and given the primary symptom of pain in your back, left hip, and radiating down her left leg, we obtained an MRI of your lower back which was normal.  Your symptoms still sound most consistent with sciatica so we provided some information for you to read about, but it is also possible that you passed a kidney stone nor having some discomfort from that.  Regardless, please follow-up with your primary care doctor as soon as possible for a follow-up visit.  If he develop any new or worsening symptoms that concern you, or your pain worsens or is not helped by the prescribed pain medication, please return to the emergency department for reassessment.   Abdominal Pain, Adult Many things can cause abdominal pain. Usually, abdominal pain is not caused by a disease and will improve without treatment. It can often be observed and treated at home. Your health care provider will do a physical exam and possibly order blood tests and X-rays to help determine the seriousness of your pain. However, in many cases, more time must pass before a clear cause of the pain can be found. Before that point, your health care provider may not know if you need more testing or further treatment. HOME CARE INSTRUCTIONS Monitor your abdominal pain for any changes. The following actions may help to alleviate any discomfort you are experiencing:  Only take over-the-counter or prescription medicines as directed by your health care provider.  Do not take laxatives unless directed to do so by your health care provider.  Try a clear liquid diet (broth, tea, or water) as directed by your health care provider. Slowly move to a bland diet as tolerated. SEEK MEDICAL CARE IF:  You have unexplained abdominal pain.  You have abdominal pain associated with nausea or diarrhea.  You have pain when you urinate or have a bowel  movement.  You experience abdominal pain that wakes you in the night.  You have abdominal pain that is worsened or improved by eating food.  You have abdominal pain that is worsened with eating fatty foods.  You have a fever. SEEK IMMEDIATE MEDICAL CARE IF:  Your pain does not go away within 2 hours.  You keep throwing up (vomiting).  Your pain is felt only in portions of the abdomen, such as the right side or the left lower portion of the abdomen.  You pass bloody or black tarry stools. MAKE SURE YOU:  Understand these instructions.  Will watch your condition.  Will get help right away if you are not doing well or get worse.   This information is not intended to replace advice given to you by your health care provider. Make sure you discuss any questions you have with your health care provider.   Document Released: 08/11/2005 Document Revised: 07/23/2015 Document Reviewed: 07/11/2013 Elsevier Interactive Patient Education 2016 Elsevier Inc.  Flank Pain Flank pain refers to pain that is located on the side of the body between the upper abdomen and the back. The pain may occur over a short period of time (acute) or may be long-term or reoccurring (chronic). It may be mild or severe. Flank pain can be caused by many things. CAUSES  Some of the more common causes of flank pain include:  Muscle strains.   Muscle spasms.   A disease of your spine (vertebral disk disease).   A lung infection (pneumonia).  Fluid around your lungs (pulmonary edema).   A kidney infection.   Kidney stones.   A very painful skin rash caused by the chickenpox virus (shingles).   Gallbladder disease.  HOME CARE INSTRUCTIONS  Home care will depend on the cause of your pain. In general,  Rest as directed by your caregiver.  Drink enough fluids to keep your urine clear or pale yellow.  Only take over-the-counter or prescription medicines as directed by your caregiver. Some  medicines may help relieve the pain.  Tell your caregiver about any changes in your pain.  Follow up with your caregiver as directed. SEEK IMMEDIATE MEDICAL CARE IF:   Your pain is not controlled with medicine.   You have new or worsening symptoms.  Your pain increases.   You have abdominal pain.   You have shortness of breath.   You have persistent nausea or vomiting.   You have swelling in your abdomen.   You feel faint or pass out.   You have blood in your urine.  You have a fever or persistent symptoms for more than 2-3 days.  You have a fever and your symptoms suddenly get worse. MAKE SURE YOU:   Understand these instructions.  Will watch your condition.  Will get help right away if you are not doing well or get worse.   This information is not intended to replace advice given to you by your health care provider. Make sure you discuss any questions you have with your health care provider.   Document Released: 12/23/2005 Document Revised: 07/26/2012 Document Reviewed: 06/15/2012 Elsevier Interactive Patient Education 2016 Elsevier Inc.  Sciatica Sciatica is pain, weakness, numbness, or tingling along the path of the sciatic nerve. The nerve starts in the lower back and runs down the back of each leg. The nerve controls the muscles in the lower leg and in the back of the knee, while also providing sensation to the back of the thigh, lower leg, and the sole of your foot. Sciatica is a symptom of another medical condition. For instance, nerve damage or certain conditions, such as a herniated disk or bone spur on the spine, pinch or put pressure on the sciatic nerve. This causes the pain, weakness, or other sensations normally associated with sciatica. Generally, sciatica only affects one side of the body. CAUSES   Herniated or slipped disc.  Degenerative disk disease.  A pain disorder involving the narrow muscle in the buttocks (piriformis  syndrome).  Pelvic injury or fracture.  Pregnancy.  Tumor (rare). SYMPTOMS  Symptoms can vary from mild to very severe. The symptoms usually travel from the low back to the buttocks and down the back of the leg. Symptoms can include:  Mild tingling or dull aches in the lower back, leg, or hip.  Numbness in the back of the calf or sole of the foot.  Burning sensations in the lower back, leg, or hip.  Sharp pains in the lower back, leg, or hip.  Leg weakness.  Severe back pain inhibiting movement. These symptoms may get worse with coughing, sneezing, laughing, or prolonged sitting or standing. Also, being overweight may worsen symptoms. DIAGNOSIS  Your caregiver will perform a physical exam to look for common symptoms of sciatica. He or she may ask you to do certain movements or activities that would trigger sciatic nerve pain. Other tests may be performed to find the cause of the sciatica. These may include:  Blood tests.  X-rays.  Imaging tests, such as an MRI  or CT scan. TREATMENT  Treatment is directed at the cause of the sciatic pain. Sometimes, treatment is not necessary and the pain and discomfort goes away on its own. If treatment is needed, your caregiver may suggest:  Over-the-counter medicines to relieve pain.  Prescription medicines, such as anti-inflammatory medicine, muscle relaxants, or narcotics.  Applying heat or ice to the painful area.  Steroid injections to lessen pain, irritation, and inflammation around the nerve.  Reducing activity during periods of pain.  Exercising and stretching to strengthen your abdomen and improve flexibility of your spine. Your caregiver may suggest losing weight if the extra weight makes the back pain worse.  Physical therapy.  Surgery to eliminate what is pressing or pinching the nerve, such as a bone spur or part of a herniated disk. HOME CARE INSTRUCTIONS   Only take over-the-counter or prescription medicines for pain  or discomfort as directed by your caregiver.  Apply ice to the affected area for 20 minutes, 3-4 times a day for the first 48-72 hours. Then try heat in the same way.  Exercise, stretch, or perform your usual activities if these do not aggravate your pain.  Attend physical therapy sessions as directed by your caregiver.  Keep all follow-up appointments as directed by your caregiver.  Do not wear high heels or shoes that do not provide proper support.  Check your mattress to see if it is too soft. A firm mattress may lessen your pain and discomfort. SEEK IMMEDIATE MEDICAL CARE IF:   You lose control of your bowel or bladder (incontinence).  You have increasing weakness in the lower back, pelvis, buttocks, or legs.  You have redness or swelling of your back.  You have a burning sensation when you urinate.  You have pain that gets worse when you lie down or awakens you at night.  Your pain is worse than you have experienced in the past.  Your pain is lasting longer than 4 weeks.  You are suddenly losing weight without reason. MAKE SURE YOU:  Understand these instructions.  Will watch your condition.  Will get help right away if you are not doing well or get worse.   This information is not intended to replace advice given to you by your health care provider. Make sure you discuss any questions you have with your health care provider.   Document Released: 10/26/2001 Document Revised: 07/23/2015 Document Reviewed: 03/12/2012 Elsevier Interactive Patient Education [employer]2016 [employer] Inc[employer]

## 2016-03-11 NOTE — ED Notes (Signed)
Pt returned from mri, pt reports improved pain.

## 2016-03-11 NOTE — ED Notes (Signed)
Pt back from MRI to room 16

## 2016-03-11 NOTE — ED Notes (Signed)
Pt transported to MRI 

## 2016-09-17 ENCOUNTER — Emergency Department: Payer: Medicaid Other

## 2016-09-17 ENCOUNTER — Emergency Department
Admission: EM | Admit: 2016-09-17 | Discharge: 2016-09-17 | Disposition: A | Discharge status: Home / Self Care | Payer: Medicaid Other | Attending: Emergency Medicine | Admitting: Emergency Medicine

## 2016-09-17 ENCOUNTER — Encounter: Payer: Self-pay | Admitting: Emergency Medicine

## 2016-09-17 DIAGNOSIS — R109 Unspecified abdominal pain: Secondary | ICD-10-CM | POA: Insufficient documentation

## 2016-09-17 DIAGNOSIS — J45909 Unspecified asthma, uncomplicated: Secondary | ICD-10-CM | POA: Insufficient documentation

## 2016-09-17 DIAGNOSIS — Z79899 Other long term (current) drug therapy: Secondary | ICD-10-CM | POA: Insufficient documentation

## 2016-09-17 DIAGNOSIS — Z791 Long term (current) use of non-steroidal anti-inflammatories (NSAID): Secondary | ICD-10-CM | POA: Insufficient documentation

## 2016-09-17 LAB — URINALYSIS COMPLETE WITH MICROSCOPIC (ARMC ONLY)
Bilirubin Urine: NEGATIVE
Glucose, UA: NEGATIVE mg/dL
KETONES UR: NEGATIVE mg/dL
NITRITE: NEGATIVE
PH: 6 (ref 5.0–8.0)
PROTEIN: NEGATIVE mg/dL
SPECIFIC GRAVITY, URINE: 1.017 (ref 1.005–1.030)

## 2016-09-17 LAB — BASIC METABOLIC PANEL
Anion gap: 7 (ref 5–15)
BUN: 8 mg/dL (ref 6–20)
CALCIUM: 9 mg/dL (ref 8.9–10.3)
CO2: 25 mmol/L (ref 22–32)
CREATININE: 0.61 mg/dL (ref 0.44–1.00)
Chloride: 105 mmol/L (ref 101–111)
GFR calc Af Amer: >60 mL/min (ref >60)
GLUCOSE: 112 mg/dL — AB (ref 65–99)
Potassium: 3.5 mmol/L (ref 3.5–5.1)
SODIUM: 137 mmol/L (ref 135–145)

## 2016-09-17 LAB — CBC
HEMATOCRIT: 41.7 % (ref 35.0–47.0)
Hemoglobin: 13.7 g/dL (ref 12.0–16.0)
MCH: 28.2 pg (ref 26.0–34.0)
MCHC: 33 g/dL (ref 32.0–36.0)
MCV: 85.6 fL (ref 80.0–100.0)
PLATELETS: 343 10*3/uL (ref 150–440)
RBC: 4.87 MIL/uL (ref 3.80–5.20)
RDW: 13.9 % (ref 11.5–14.5)
WBC: 9.8 10*3/uL (ref 3.6–11.0)

## 2016-09-17 LAB — POCT PREGNANCY, URINE: Preg Test, Ur: NEGATIVE

## 2016-09-17 MED ORDER — NAPROXEN 500 MG PO TABS: 500.0000 mg | ORAL_TABLET | Freq: Two times a day (BID) | ORAL | 0 refills | Status: DC

## 2016-09-17 MED ORDER — CYCLOBENZAPRINE HCL 10 MG PO TABS
10.0000 mg | ORAL_TABLET | Freq: Once | ORAL | Status: AC
  Administered 2016-09-17: 10 mg via ORAL
  Filled 2016-09-17: qty 1

## 2016-09-17 MED ORDER — NAPROXEN 500 MG PO TABS
500.0000 mg | ORAL_TABLET | Freq: Once | ORAL | Status: AC
  Administered 2016-09-17: 500 mg via ORAL
  Filled 2016-09-17: qty 1

## 2016-09-17 MED ORDER — CYCLOBENZAPRINE HCL 10 MG PO TABS: 10.0000 mg | ORAL_TABLET | Freq: Three times a day (TID) | ORAL | 0 refills | Status: DC | PRN

## 2016-09-17 NOTE — ED Triage Notes (Signed)
Pt states left flank pain started about 2 days ago. Pt states she has tried heat but is not working. Pt states she is trying to get pregnant but has not had any abdominal pain or vaginal discharge. Denies any dysuria but c/o pain to her kidney and side.  Endorses nausea.  Poor appetite since the pain started.

## 2016-09-17 NOTE — ED Notes (Signed)
Left flank pain X 2 days, no relief with heat. Denies urinary sx or abdominal pain Pt alert and oriented X4, active, cooperative, pt in NAD. RR even and unlabored, color WNL.

## 2016-09-17 NOTE — ED Provider Notes (Signed)
[facility] Emergency Department Provider Note   ____________________________________________   First MD Initiated Contact with Patient 09/17/16 2014     (approximate)  I have reviewed the triage vital signs and the nursing notes.   HISTORY  Chief Complaint Flank Pain   HPI Courtney Sanders is a 25 y.o. female who presents to the emergency department for evaluation of left flank pain. She states the pain started about 2 days ago. She states that she has tried heat and Tylenol without relief. She denies abdominal pain, vaginal discharge, or dysuria. She states that she has had this same pain multiple times in the past and has been placed on antibiotics several times but the pain returns randomly and without measurable intervals. She denies injury.   Past Medical History:  Diagnosis Date  . Asthma     There are no active problems to display for this patient.   Past Surgical History:  Procedure Laterality Date  . CHOLECYSTECTOMY      Prior to Admission medications   Medication Sig Start Date End Date Taking? Authorizing Provider  albuterol (PROVENTIL HFA;VENTOLIN HFA) 108 (90 Base) MCG/ACT inhaler Inhale 2 puffs into the lungs 2 (two) times daily.    Historical Provider, MD  cetirizine (ZYRTEC) 10 MG tablet Take 10 mg by mouth daily.    Historical Provider, MD  cyclobenzaprine (FLEXERIL) 10 MG tablet Take 1 tablet (10 mg total) by mouth 3 (three) times daily as needed for muscle spasms. 09/17/16   Chinita Pesterari B Shevette Bess, FNP  ibuprofen (ADVIL,MOTRIN) 200 MG tablet Take 400 mg by mouth every 6 (six) hours as needed.    Historical Provider, MD  naproxen (NAPROSYN) 500 MG tablet Take 1 tablet (500 mg total) by mouth 2 (two) times daily with a meal. 09/17/16   Chinita Pesterari B Afsana Liera, FNP  ondansetron (ZOFRAN) 4 MG tablet Take 1-2 tabs by mouth every 8 hours as needed for nausea/vomiting 03/11/16   Loleta Roseory Forbach, MD    Allergies Unisom [doxylamine succinate  (sleep)]  History reviewed. No pertinent family history.  Social History Social History  Substance Use Topics  . Smoking status: Never Smoker  . Smokeless tobacco: Never Used  . Alcohol use Yes    Review of Systems Constitutional: No fever/chills Eyes: No visual changes. ENT: No sore throat. Cardiovascular: Denies chest pain. Respiratory: Denies shortness of breath. Gastrointestinal: No abdominal pain.  No nausea, no vomiting.  No diarrhea.  No constipation. Genitourinary: Negative for dysuria. Musculoskeletal: Negative for back pain. Positive for left flank pain. Skin: Negative for rash. Neurological: Negative for headaches, focal weakness or numbness. ____________________________________________   PHYSICAL EXAM:  VITAL SIGNS: ED Triage Vitals  Enc Vitals Group     BP 09/17/16 1632 122/69     Pulse Rate 09/17/16 2155 66     Resp 09/17/16 1632 18     Temp 09/17/16 1632 98.6 F (37 C)     Temp Source 09/17/16 1632 Oral     SpO2 09/17/16 1632 100 %     Weight 09/17/16 1633 172 lb (78 kg)     Height 09/17/16 1633 5\' 2"  (1.575 m)     Head Circumference --      Peak Flow --      Pain Score 09/17/16 1650 9     Pain Loc --      Pain Edu? --      Excl. in GC? --     Constitutional: Alert and oriented. Well appearing and in  no acute distress. Eyes: Conjunctivae are normal. EOMI. Head: Atraumatic. Nose: No congestion/rhinnorhea. Mouth/Throat: Mucous membranes are moist.  Oropharynx non-erythematous. Neck: No stridor.   Cardiovascular: Normal rate, regular rhythm. Grossly normal heart sounds.  Good peripheral circulation. Respiratory: Normal respiratory effort.  No retractions. Lungs CTAB. Gastrointestinal: Soft and nontender. No distention. No abdominal bruits. No CVA tenderness. Musculoskeletal: No lower extremity tenderness nor edema.  No joint effusions. Observed ambulating unassisted with steady gait. Neurologic:  Normal speech and language. No gross focal  neurologic deficits are appreciated. No gait instability. Skin:  Skin is warm, dry and intact. No rash noted. Psychiatric: Mood and affect are normal. Speech and behavior are normal.  ____________________________________________   LABS (all labs ordered are listed, but only abnormal results are displayed)  Labs Reviewed  URINALYSIS COMPLETEWITH MICROSCOPIC ([facility] ONLY) - Abnormal; Notable for the following:       Result Value   Color, Urine YELLOW (*)    APPearance HAZY (*)    Hgb urine dipstick 1+ (*)    Leukocytes, UA 2+ (*)    Bacteria, UA RARE (*)    Squamous Epithelial / LPF 0-5 (*)    All other components within normal limits  BASIC METABOLIC PANEL - Abnormal; Notable for the following:    Glucose, Bld 112 (*)    All other components within normal limits  CBC  POCT PREGNANCY, URINE  POC URINE PREG, ED   ____________________________________________  EKG  Not indicated ____________________________________________  RADIOLOGY  Small amount of free fluid within the pelvis, likely physiologic per radiology. ____________________________________________   PROCEDURES  Procedure(s) performed: None  Procedures  Critical Care performed: No  ____________________________________________   INITIAL IMPRESSION / ASSESSMENT AND PLAN / ED COURSE  Pertinent labs & imaging results that were available during my care of the patient were reviewed by me and considered in my medical decision making (see chart for details).  [facility]. She was advised to take the Flexeril and Naprosyn as prescribed. She was instructed to return to the ER for symptoms that change or worsen if unable to see her PCP.   Clinical Course     ____________________________________________   FINAL CLINICAL IMPRESSION(S) / ED DIAGNOSES  Final diagnoses:  Flank pain      NEW MEDICATIONS STARTED DURING THIS VISIT:  Discharge Medication List as  of 09/17/2016  9:37 PM    START taking these medications   Details  cyclobenzaprine (FLEXERIL) 10 MG tablet Take 1 tablet (10 mg total) by mouth 3 (three) times daily as needed for muscle spasms., Starting Fri 09/17/2016, Print    naproxen (NAPROSYN) 500 MG tablet Take 1 tablet (500 mg total) by mouth 2 (two) times daily with a meal., Starting Fri 09/17/2016, Print         Note:  This document was prepared using Dragon voice recognition software and may include unintentional dictation errors.    Chinita PesterCari B Jazzie Trampe, FNP 09/17/16 [id number]    Sharyn CreamerMark Quale, MD 09/18/16 [phone]

## 2016-11-15 NOTE — L&D Delivery Note (Signed)
Date of delivery: 08/19/17 Estimated Date of Delivery: 08/28/17 Patient's last menstrual period was 11/21/2016 (exact date). EGA: [redacted]w[redacted]d  Delivery Note At 1:10 PM a viable female was delivered via Vaginal, Spontaneous Delivery (Presentation: ROA; cephalic).  APGAR: 8, 9; weight 7 lb 9 oz (3430 g).   Placenta status: spontaneous, intact.  Cord: 3 vessels, with the following complications: body cord, loose.  Cord pH: not collected  Anesthesia:  epidural Episiotomy: None Lacerations: 2nd degree;Perineal Suture Repair: 3.0 vicryl rapide Est. Blood Loss (mL):  250cc  Mom presented to L&D with labor, AROM'd, augmented with pitocin.  epidual placed. Progressed to complete, second stage: <31min, with delivery of fetal head with restitution to LOT   Anterior then posterior shoulders delivered without difficulty.  Baby placed on mom's chest, and attended to by peds.  We sang happy birthday to baby Whiteside.  Cord was then clamped and cut by FOB.  Placenta spontaneously delivered, intact.   IV pitocin given for hemorrhage prophylaxis. Perineal 2nd degree repaired in standard fashion. Mom to postpartum.  Baby to Couplet care / Skin to Skin.  Chelsea C Ward 08/19/2017, 8:04 PM

## 2017-02-21 ENCOUNTER — Emergency Department
Admission: EM | Admit: 2017-02-21 | Discharge: 2017-02-21 | Disposition: A | Discharge status: Home / Self Care | Payer: Medicaid Other | Attending: Emergency Medicine | Admitting: Emergency Medicine

## 2017-02-21 ENCOUNTER — Encounter: Payer: Self-pay | Admitting: Emergency Medicine

## 2017-02-21 ENCOUNTER — Emergency Department: Payer: Medicaid Other

## 2017-02-21 DIAGNOSIS — R1032 Left lower quadrant pain: Secondary | ICD-10-CM | POA: Diagnosis not present

## 2017-02-21 DIAGNOSIS — J45909 Unspecified asthma, uncomplicated: Secondary | ICD-10-CM | POA: Insufficient documentation

## 2017-02-21 DIAGNOSIS — Z3A13 13 weeks gestation of pregnancy: Secondary | ICD-10-CM | POA: Insufficient documentation

## 2017-02-21 DIAGNOSIS — Z79899 Other long term (current) drug therapy: Secondary | ICD-10-CM | POA: Insufficient documentation

## 2017-02-21 DIAGNOSIS — R109 Unspecified abdominal pain: Secondary | ICD-10-CM

## 2017-02-21 DIAGNOSIS — R102 Pelvic and perineal pain: Secondary | ICD-10-CM | POA: Diagnosis not present

## 2017-02-21 DIAGNOSIS — O26891 Other specified pregnancy related conditions, first trimester: Secondary | ICD-10-CM | POA: Insufficient documentation

## 2017-02-21 LAB — URINALYSIS, COMPLETE (UACMP) WITH MICROSCOPIC
Bilirubin Urine: NEGATIVE
Glucose, UA: NEGATIVE mg/dL
HGB URINE DIPSTICK: NEGATIVE
KETONES UR: 80 mg/dL — AB
Nitrite: NEGATIVE
PROTEIN: NEGATIVE mg/dL
Specific Gravity, Urine: 1.025 (ref 1.005–1.030)
pH: 5 (ref 5.0–8.0)

## 2017-02-21 LAB — HCG, QUANTITATIVE, PREGNANCY: hCG, Beta Chain, Quant, S: 73294 m[IU]/mL — ABNORMAL HIGH (ref <5)

## 2017-02-21 LAB — BASIC METABOLIC PANEL
Anion gap: 7 (ref 5–15)
BUN: 8 mg/dL (ref 6–20)
CHLORIDE: 103 mmol/L (ref 101–111)
CO2: 23 mmol/L (ref 22–32)
CREATININE: 0.5 mg/dL (ref 0.44–1.00)
Calcium: 8.8 mg/dL — ABNORMAL LOW (ref 8.9–10.3)
GFR calc Af Amer: >60 mL/min (ref >60)
GFR calc non Af Amer: >60 mL/min (ref >60)
Glucose, Bld: 83 mg/dL (ref 65–99)
POTASSIUM: 3.2 mmol/L — AB (ref 3.5–5.1)
SODIUM: 133 mmol/L — AB (ref 135–145)

## 2017-02-21 LAB — CBC
HEMATOCRIT: 32.6 % — AB (ref 35.0–47.0)
HEMOGLOBIN: 11.1 g/dL — AB (ref 12.0–16.0)
MCH: 28.5 pg (ref 26.0–34.0)
MCHC: 33.9 g/dL (ref 32.0–36.0)
MCV: 83.9 fL (ref 80.0–100.0)
Platelets: 306 10*3/uL (ref 150–440)
RBC: 3.89 MIL/uL (ref 3.80–5.20)
RDW: 13.1 % (ref 11.5–14.5)
WBC: 8.7 10*3/uL (ref 3.6–11.0)

## 2017-02-21 MED ORDER — NITROFURANTOIN MONOHYD MACRO 100 MG PO CAPS
ORAL_CAPSULE | ORAL | Status: AC
  Administered 2017-02-21: 100 mg via ORAL
  Filled 2017-02-21: qty 1

## 2017-02-21 MED ORDER — ACETAMINOPHEN 500 MG PO TABS
1000.0000 mg | ORAL_TABLET | Freq: Once | ORAL | Status: AC
  Administered 2017-02-21: 1000 mg via ORAL
  Filled 2017-02-21: qty 2

## 2017-02-21 MED ORDER — NITROFURANTOIN MONOHYD MACRO 100 MG PO CAPS
100.0000 mg | ORAL_CAPSULE | Freq: Once | ORAL | Status: AC
  Administered 2017-02-21: 100 mg via ORAL
  Filled 2017-02-21: qty 1

## 2017-02-21 NOTE — ED Triage Notes (Signed)
Pt awoke this morning with lower abd pain that hurts into her hips - she is 3 months pregnant at this time - denies any bleeding - denies urinary frequency or pain with urination - pt states that she is constipated and has not had a BM in the past week and feels blosted

## 2017-02-21 NOTE — ED Provider Notes (Signed)
[facility] Emergency Department Provider Note  ____________________________________________  Time seen: Approximately 3:49 PM  I have reviewed the triage vital signs and the nursing notes.   HISTORY  Chief Complaint Abdominal Pain   HPI Courtney Sanders is a 26 y.o. female G4P3 currently estimated GA of [redacted] weeks per LMP with history of asthma who presents for evaluation of abdominal pain. Patient has seen her family physician for this pregnancy however has not undergone an ultrasound yet. Today she reports that she was pushing a chair in the office with her leg when she developed sudden onset of lower sharp abdominal pain. She reports that the pain is severe, constant, located in the suprapubic and left lower side of her abdomen. She denies ever having similar pain. No nausea, no vomiting, no diarrhea, no dysuria, no hematuria, no vaginal bleeding, no fever or chills, no vaginal discharge. Patient reports that she was barely able to walk due to the severity of the pain. She denies any prior complications with her other pregnancies.  Past Medical History:  Diagnosis Date  . Asthma     There are no active problems to display for this patient.   Past Surgical History:  Procedure Laterality Date  . CHOLECYSTECTOMY      Prior to Admission medications   Medication Sig Start Date End Date Taking? Authorizing Provider  albuterol (PROVENTIL HFA;VENTOLIN HFA) 108 (90 Base) MCG/ACT inhaler Inhale 2 puffs into the lungs 2 (two) times daily.    Historical Provider, MD  cetirizine (ZYRTEC) 10 MG tablet Take 10 mg by mouth daily.    Historical Provider, MD  cyclobenzaprine (FLEXERIL) 10 MG tablet Take 1 tablet (10 mg total) by mouth 3 (three) times daily as needed for muscle spasms. 09/17/16   Chinita Pester, FNP  ibuprofen (ADVIL,MOTRIN) 200 MG tablet Take 400 mg by mouth every 6 (six) hours as needed.    Historical Provider, MD  naproxen (NAPROSYN) 500  MG tablet Take 1 tablet (500 mg total) by mouth 2 (two) times daily with a meal. 09/17/16   Chinita Pester, FNP  ondansetron (ZOFRAN) 4 MG tablet Take 1-2 tabs by mouth every 8 hours as needed for nausea/vomiting 03/11/16   Loleta Rose, MD    Allergies Unisom [doxylamine succinate (sleep)]  No family history on file.  Social History Social History  Substance Use Topics  . Smoking status: Never Smoker  . Smokeless tobacco: Never Used  . Alcohol use Yes    Review of Systems  Constitutional: Negative for fever. Eyes: Negative for visual changes. ENT: Negative for sore throat. Neck: No neck pain  Cardiovascular: Negative for chest pain. Respiratory: Negative for shortness of breath. Gastrointestinal: + suprapubic and LLQ abdominal pain. No vomiting or diarrhea. Genitourinary: Negative for dysuria. Musculoskeletal: Negative for back pain. Skin: Negative for rash. Neurological: Negative for headaches, weakness or numbness. Psych: No SI or HI  ____________________________________________   PHYSICAL EXAM:  VITAL SIGNS: ED Triage Vitals [02/21/17 1431]  Enc Vitals Group     BP (!) 100/53     Pulse Rate 66     Resp 16     Temp 98.4 F (36.9 C)     Temp Source Oral     SpO2 100 %     Weight 175 lb (79.4 kg)     Height  (1.575 m)     Head Circumference      Peak Flow      Pain Score 9  Pain Loc      Pain Edu?      Excl. in GC?     Constitutional: Alert and oriented. Well appearing and in no apparent distress. HEENT:      Head: Normocephalic and atraumatic.         Eyes: Conjunctivae are normal. Sclera is non-icteric. EOMI. PERRL      Mouth/Throat: Mucous membranes are moist.       Neck: Supple with no signs of meningismus. Cardiovascular: Regular rate and rhythm. No murmurs, gallops, or rubs. 2+ symmetrical distal pulses are present in all extremities. No JVD. Respiratory: Normal respiratory effort. Lungs are clear to auscultation bilaterally. No wheezes,  crackles, or rhonchi.  Gastrointestinal: Soft, mild suprapubic ttp, no LLQ or RLQ ttp,  non distended with positive bowel sounds. No rebound or guarding. Genitourinary: No CVA tenderness. Musculoskeletal: Nontender with normal range of motion in all extremities. No edema, cyanosis, or erythema of extremities. Neurologic: Normal speech and language. Face is symmetric. Moving all extremities. No gross focal neurologic deficits are appreciated. Skin: Skin is warm, dry and intact. No rash noted. Psychiatric: Mood and affect are normal. Speech and behavior are normal.  ____________________________________________   LABS (all labs ordered are listed, but only abnormal results are displayed)  Labs Reviewed  HCG, QUANTITATIVE, PREGNANCY - Abnormal; Notable for the following:       Result Value   hCG, Beta Chain, Quant, S 73,294 (*)    All other components within normal limits  CBC - Abnormal; Notable for the following:    Hemoglobin 11.1 (*)    HCT 32.6 (*)    All other components within normal limits  BASIC METABOLIC PANEL - Abnormal; Notable for the following:    Sodium 133 (*)    Potassium 3.2 (*)    Calcium 8.8 (*)    All other components within normal limits  URINALYSIS, COMPLETE (UACMP) WITH MICROSCOPIC - Abnormal; Notable for the following:    Color, Urine YELLOW (*)    APPearance HAZY (*)    Ketones, ur 80 (*)    Leukocytes, UA MODERATE (*)    Bacteria, UA FEW (*)    Squamous Epithelial / LPF 6-30 (*)    All other components within normal limits   ____________________________________________  EKG  none ____________________________________________  RADIOLOGY  TVUS: 1. Single living intrauterine gestation with an estimated gestational age of [redacted] weeks and 2 days. 2. No complications identified.  ____________________________________________   PROCEDURES  Procedure(s) performed: None Procedures Critical Care performed:   None ____________________________________________   INITIAL IMPRESSION / ASSESSMENT AND PLAN / ED COURSE  26 y.o. female G4P3 currently estimated GA of [redacted] weeks per LMP with history of asthma who presents for evaluation of abdominal pain that has been present since earlier today while she was pushing a chair with her leg. The history sounds like possible round ligament pain however due to the severity of the pain and the fact the patient has not had an ultrasound yet will pursue a transvaginal ultrasound to rule out ectopic pregnancy. Labs pending.  Clinical Course as of Feb 21 1750  Mon Feb 21, 2017  1749 US showing IUP with no complications. Pain improved with tylenol. UA concerning for bacteriuria, patient started on macrobid. Patient remains with no ttp over the RLQ. Recommended close f/u with Obgyn for initiation of prenatal care.   [CV]    Clinical Course User Index [CV] Nita Sickle, MD    Pertinent labs & imaging results that were  available during my care of the patient were reviewed by me and considered in my medical decision making (see chart for details).    ____________________________________________   FINAL CLINICAL IMPRESSION(S) / ED DIAGNOSES  Final diagnoses:  Abdominal pain during pregnancy in first trimester      NEW MEDICATIONS STARTED DURING THIS VISIT:  New Prescriptions   No medications on file     Note:  This document was prepared using Dragon voice recognition software and may include unintentional dictation errors.    Nita Sickle, MD 02/21/17 [id number]

## 2017-02-21 NOTE — ED Notes (Addendum)
States lower abd pain that began today, states last BM last week, passing gas, pt is 3 months pregnant, denies any vaginal bleeding and discharge, pt awake and alert in no acute distress

## 2017-02-21 NOTE — ED Notes (Signed)
Patient transported to Ultrasound 

## 2017-03-22 ENCOUNTER — Other Ambulatory Visit: Payer: Self-pay | Admitting: Family Medicine

## 2017-03-22 DIAGNOSIS — Z3482 Encounter for supervision of other normal pregnancy, second trimester: Secondary | ICD-10-CM

## 2017-04-05 ENCOUNTER — Encounter: Payer: Self-pay | Admitting: Advanced Practice Midwife

## 2017-04-06 ENCOUNTER — Ambulatory Visit
Admission: RE | Admit: 2017-04-06 | Discharge: 2017-04-06 | Disposition: A | Discharge status: Home / Self Care | Payer: Medicaid Other | Source: Ambulatory Visit | Attending: Family Medicine | Admitting: Family Medicine

## 2017-04-06 DIAGNOSIS — Z3A20 20 weeks gestation of pregnancy: Secondary | ICD-10-CM | POA: Insufficient documentation

## 2017-04-06 DIAGNOSIS — Z3482 Encounter for supervision of other normal pregnancy, second trimester: Secondary | ICD-10-CM

## 2017-04-07 ENCOUNTER — Encounter: Payer: Self-pay | Admitting: Advanced Practice Midwife

## 2017-04-29 ENCOUNTER — Observation Stay
Admission: EM | Admit: 2017-04-29 | Discharge: 2017-04-30 | Disposition: A | Discharge status: Home / Self Care | Payer: Medicaid Other | Attending: Obstetrics and Gynecology | Admitting: Obstetrics and Gynecology

## 2017-04-29 DIAGNOSIS — Z3A22 22 weeks gestation of pregnancy: Secondary | ICD-10-CM | POA: Diagnosis not present

## 2017-04-29 DIAGNOSIS — O99512 Diseases of the respiratory system complicating pregnancy, second trimester: Secondary | ICD-10-CM | POA: Insufficient documentation

## 2017-04-29 DIAGNOSIS — R112 Nausea with vomiting, unspecified: Secondary | ICD-10-CM | POA: Diagnosis present

## 2017-04-29 DIAGNOSIS — K529 Noninfective gastroenteritis and colitis, unspecified: Secondary | ICD-10-CM

## 2017-04-29 DIAGNOSIS — J45909 Unspecified asthma, uncomplicated: Secondary | ICD-10-CM | POA: Insufficient documentation

## 2017-04-29 DIAGNOSIS — E860 Dehydration: Secondary | ICD-10-CM | POA: Diagnosis not present

## 2017-04-29 DIAGNOSIS — Z79899 Other long term (current) drug therapy: Secondary | ICD-10-CM | POA: Diagnosis not present

## 2017-04-29 DIAGNOSIS — O99282 Endocrine, nutritional and metabolic diseases complicating pregnancy, second trimester: Secondary | ICD-10-CM | POA: Insufficient documentation

## 2017-04-29 DIAGNOSIS — O219 Vomiting of pregnancy, unspecified: Secondary | ICD-10-CM | POA: Diagnosis present

## 2017-04-29 DIAGNOSIS — O99612 Diseases of the digestive system complicating pregnancy, second trimester: Secondary | ICD-10-CM | POA: Diagnosis not present

## 2017-04-29 LAB — COMPREHENSIVE METABOLIC PANEL
ALBUMIN: 3.1 g/dL — AB (ref 3.5–5.0)
ALK PHOS: 46 U/L (ref 38–126)
ALT: 10 U/L — AB (ref 14–54)
ANION GAP: 8 (ref 5–15)
AST: 16 U/L (ref 15–41)
BILIRUBIN TOTAL: 0.7 mg/dL (ref 0.3–1.2)
BUN: 6 mg/dL (ref 6–20)
CALCIUM: 8.5 mg/dL — AB (ref 8.9–10.3)
CO2: 22 mmol/L (ref 22–32)
CREATININE: 0.43 mg/dL — AB (ref 0.44–1.00)
Chloride: 106 mmol/L (ref 101–111)
GFR calc non Af Amer: >60 mL/min (ref >60)
GLUCOSE: 97 mg/dL (ref 65–99)
Potassium: 3.3 mmol/L — ABNORMAL LOW (ref 3.5–5.1)
SODIUM: 136 mmol/L (ref 135–145)
TOTAL PROTEIN: 6.4 g/dL — AB (ref 6.5–8.1)

## 2017-04-29 LAB — CBC
HEMATOCRIT: 32.1 % — AB (ref 35.0–47.0)
HEMOGLOBIN: 11 g/dL — AB (ref 12.0–16.0)
MCH: 28 pg (ref 26.0–34.0)
MCHC: 34.1 g/dL (ref 32.0–36.0)
MCV: 82.1 fL (ref 80.0–100.0)
Platelets: 305 10*3/uL (ref 150–440)
RBC: 3.91 MIL/uL (ref 3.80–5.20)
RDW: 14.1 % (ref 11.5–14.5)
WBC: 9.9 10*3/uL (ref 3.6–11.0)

## 2017-04-29 LAB — TYPE AND SCREEN
ABO/RH(D): O POS
Antibody Screen: NEGATIVE

## 2017-04-29 LAB — URINALYSIS, COMPLETE (UACMP) WITH MICROSCOPIC
Bilirubin Urine: NEGATIVE
Glucose, UA: NEGATIVE mg/dL
Hgb urine dipstick: NEGATIVE
KETONES UR: 80 mg/dL — AB
Leukocytes, UA: NEGATIVE
Nitrite: NEGATIVE
PROTEIN: 30 mg/dL — AB
Specific Gravity, Urine: 1.026 (ref 1.005–1.030)
pH: 5 (ref 5.0–8.0)

## 2017-04-29 LAB — WET PREP, GENITAL
CLUE CELLS WET PREP: NONE SEEN
Sperm: NONE SEEN
Trich, Wet Prep: NONE SEEN
YEAST WET PREP: NONE SEEN

## 2017-04-29 LAB — CHLAMYDIA/NGC RT PCR (ARMC ONLY)
Chlamydia Tr: NOT DETECTED
N gonorrhoeae: NOT DETECTED

## 2017-04-29 LAB — LACTIC ACID, PLASMA: Lactic Acid, Venous: 1.2 mmol/L (ref 0.5–1.9)

## 2017-04-29 MED ORDER — LACTATED RINGERS IV BOLUS (SEPSIS)
1000.0000 mL | Freq: Once | INTRAVENOUS | Status: AC
  Administered 2017-04-29: 1000 mL via INTRAVENOUS

## 2017-04-29 MED ORDER — ALUM & MAG HYDROXIDE-SIMETH 200-200-20 MG/5ML PO SUSP
30.0000 mL | Freq: Once | ORAL | Status: AC
  Administered 2017-04-29: 30 mL via ORAL
  Filled 2017-04-29: qty 30

## 2017-04-29 MED ORDER — ONDANSETRON HCL 4 MG/2ML IJ SOLN
8.0000 mg | Freq: Three times a day (TID) | INTRAMUSCULAR | Status: DC | PRN
  Administered 2017-04-29 – 2017-04-30 (×2): 8 mg via INTRAVENOUS
  Filled 2017-04-29: qty 4

## 2017-04-29 MED ORDER — SIMETHICONE 80 MG PO CHEW
80.0000 mg | CHEWABLE_TABLET | Freq: Once | ORAL | Status: AC
  Administered 2017-04-29: 80 mg via ORAL
  Filled 2017-04-29: qty 1

## 2017-04-29 MED ORDER — ACETAMINOPHEN 160 MG/5ML PO SOLN
960.0000 mg | Freq: Four times a day (QID) | ORAL | Status: DC | PRN
  Administered 2017-04-29: 960 mg via ORAL
  Filled 2017-04-29 (×3): qty 30

## 2017-04-29 MED ORDER — ONDANSETRON HCL 4 MG/2ML IJ SOLN
INTRAMUSCULAR | Status: AC
  Administered 2017-04-29: 8 mg via INTRAVENOUS
  Filled 2017-04-29: qty 4

## 2017-04-29 MED ORDER — KCL-LACTATED RINGERS-D5W 20 MEQ/L IV SOLN
INTRAVENOUS | Status: DC
  Administered 2017-04-30: 01:00:00 via INTRAVENOUS
  Filled 2017-04-29 (×5): qty 1000

## 2017-04-29 NOTE — OB Triage Note (Signed)
Pt presents to [facility] via ED with nausea and vomiting since last night. Pt states that cramping and back pain began this morning and got progressively worse. She denies bleeding and denies gush of fluid but says every time she throws up "I feel like I'm peeing on myself but it doesn't smell like pee." Temp 99.9. Monitors applied and assessing.

## 2017-04-29 NOTE — H&P (Signed)
OB History & Physical   History of Present Illness:  Chief Complaint: nausea and stomach crampgs  HPI:  Courtney Sanders is a 26 y.o. [phone] female at [redacted]w[redacted]d (dating information not available).  Her pregnancy has been uncomplicated.    She denies contractions.   She denies leakage of fluid.   She denies vaginal bleeding.   She reports fetal movement.  She has had some nausea today with emesis. She reports severe abdominal cramping. Her pain is located in her lower abdomen.  The pain comes in waves.  She had one episode of diarrhea today, which made her pain better for a while. Then, the pain returned.  She denies urinary symptoms.  She denies vaginal symptoms, including itching, burning, irritation.    Maternal Medical History:   Past Medical History:  Diagnosis Date  . Asthma   . Ovarian cyst     Past Surgical History:  Procedure Laterality Date  . CHOLECYSTECTOMY    . Nexplanon removal      Allergies  Allergen Reactions  . Diphenhydramine Hcl Shortness Of Breath and Palpitations  . Unisom [Doxylamine Succinate (Sleep)]     Prior to Admission medications   Medication Sig Start Date End Date Taking? Authorizing Provider  albuterol (PROVENTIL HFA;VENTOLIN HFA) 108 (90 Base) MCG/ACT inhaler Inhale 2 puffs into the lungs 2 (two) times daily.    [provider]    OB History  Gravida Para Term Preterm AB Living  4 2 2  0 0 3  SAB TAB Ectopic Multiple Live Births  0 0 0   2    # Outcome Date GA Lbr Len/2nd Weight Sex Delivery Anes PTL Lv  4 Current           3 Term 09/01/10   6 lb 6 oz (2.892 kg) M Vag-Spont   LIV  2 Term 01/31/09   6 lb 3 oz (2.807 kg) M Vag-Spont   LIV  1 Gravida             Obstetric Comments  Menarche age: 18  Menses duration: 5 days    [facility]: [facility]  Social History: She  reports that she has never smoked. She has never used smokeless tobacco. She reports that she drinks alcohol. She  reports that she does not use drugs.  Family History: family history includes Asthma in her mother.   Review of Systems: Review of Systems  Constitutional: Negative.   HENT: Negative.   Eyes: Negative.   Respiratory: Negative.   Cardiovascular: Negative.   Gastrointestinal: Positive for abdominal pain, diarrhea, nausea and vomiting. Negative for blood in stool, constipation, heartburn and melena.       See HPI  Genitourinary: Negative.   Musculoskeletal: Negative.   Skin: Negative.   Neurological: Negative.   Psychiatric/Behavioral: Negative.     Physical Exam:  Vital Signs: BP (!) 96/48 (BP Location: Left Arm)   Pulse 92   Temp 98.9 F (37.2 C) (Oral)   Resp 18   Ht 5\' 2"  (1.575 m)   Wt 175 lb (79.4 kg)   LMP 11/21/2016 (Exact Date)   SpO2 100%   BMI 32.01 kg/m  Physical Exam  Constitutional: She is oriented to person, place, and time. She appears well-developed and well-nourished. She appears distressed (mild distress).  HENT:  Head: Normocephalic and atraumatic.  Eyes: Conjunctivae are normal.  Neck: Normal range of motion. Neck supple.  Cardiovascular: Normal rate, regular rhythm and normal heart sounds.  Pulmonary/Chest: Effort normal and breath sounds normal. She has no wheezes.  Abdominal: Soft. She exhibits no distension. There is no tenderness. There is no guarding. Hernia confirmed negative in the right inguinal area and confirmed negative in the left inguinal area.  Gravid, NT  Genitourinary: Vagina normal. Pelvic exam was performed with patient supine. There is no rash, tenderness or lesion on the right labia. There is no rash, tenderness or lesion on the left labia. Cervix exhibits no motion tenderness. No erythema or bleeding in the vagina. No vaginal discharge found.  Genitourinary Comments: Cvx: closed, thick, and high  Musculoskeletal: Normal range of motion.  Lymphadenopathy:       Right: No inguinal adenopathy present.       Left: No inguinal  adenopathy present.  Neurological: She is alert and oriented to person, place, and time.  Skin: Skin is warm and dry. No rash noted.  Psychiatric: She has a normal mood and affect. Her behavior is normal. Judgment normal.   Female chaperone present during pelvic exam.   Pertinent Results:  Prenatal Labs: not available  Baseline FHR: 140 beats/min   Variability: moderate   Accelerations: absent   Decelerations: absent Contractions: absent frequency: n/a Overall assessment: cat 1, reassuring for gestational age  Lab Results  Component Value Date   APPEARANCEUR CLEAR (A) 04/29/2017   GLUCOSEU NEGATIVE 04/29/2017   BILIRUBINUR NEGATIVE 04/29/2017   KETONESUR 80 (A) 04/29/2017   LABSPEC 1.026 04/29/2017   HGBUR NEGATIVE 04/29/2017   PHURINE 5.0 04/29/2017   NITRITE NEGATIVE 04/29/2017   LEUKOCYTESUR NEGATIVE 04/29/2017   RBCU 0-5 04/29/2017   WBCU 0-5 04/29/2017   BACTERIA RARE (A) 04/29/2017   EPIU 0-5 (A) 04/29/2017   MUCOUSUACOMP PRESENT 04/29/2017    Lab Results  Component Value Date   TRICHWETPREP NONE SEEN 04/29/2017   CLUECELLS NONE SEEN 04/29/2017   WBCWETPREP MANY (A) 04/29/2017   YEASTWETPREP NONE SEEN 04/29/2017    Lab Results  Component Value Date   WBC 9.9 04/29/2017   HGB 11.0 (L) 04/29/2017   HCT 32.1 (L) 04/29/2017   PLT 305 04/29/2017   K 3.3 (L) 04/29/2017   CREATININE 0.43 (L) 04/29/2017   ALT 10 (L) 04/29/2017   AST 16 04/29/2017   Assessment:  Courtney Sanders is a 26 y.o. [id number] female at 5759w5d with likely gastroenteritis, dehydration.   Plan:  1. Admit to Labor & Delivery  2. Fetwal well-being: reassuring given gestational age 43. IVF bolus. Will try to advance diet as tolerated.  4. No evidence of labor   Thomasene MohairStephen Kiyana Vazguez, MD 04/29/2017 10:47 PM

## 2017-04-30 DIAGNOSIS — Z3A22 22 weeks gestation of pregnancy: Secondary | ICD-10-CM | POA: Diagnosis not present

## 2017-04-30 DIAGNOSIS — K529 Noninfective gastroenteritis and colitis, unspecified: Secondary | ICD-10-CM | POA: Diagnosis not present

## 2017-04-30 DIAGNOSIS — O99612 Diseases of the digestive system complicating pregnancy, second trimester: Secondary | ICD-10-CM | POA: Diagnosis not present

## 2017-04-30 MED ORDER — ONDANSETRON HCL 4 MG PO TABS: 4.0000 mg | ORAL_TABLET | Freq: Three times a day (TID) | ORAL | 0 refills | Status: DC | PRN

## 2017-04-30 NOTE — Progress Notes (Signed)
Discharge instructions complete. Patient verbalizes understanding of teaching. Patient discharged home at 1050. 

## 2017-04-30 NOTE — Discharge Summary (Signed)
Physician Discharge Summary  Patient ID: Courtney Sanders Courtney Sanders MRN: [id number] DOB/AGE: 1991-06-18 26 y.o.  Admit date: 04/29/2017 Discharge date: 04/30/2017  Admission Diagnoses:Nausea and vomiting  Discharge Diagnoses:  Principal Problem:   Noninfectious gastroenteritis and colitis Active Problems:   Nausea and vomiting during pregnancy  Discharged Condition: good  Hospital Course: Pt seen and examined, see H&P.  Pregnancy at 22 weeks stable without evidence for PTL.  Dehydration with n/v/d treated and monitored with improvement over <24 hours.  Consults: None  Significant Diagnostic Studies: Normal FHTs for 22 weeks.  Treatments: IV hydration  Discharge Exam: Blood pressure (!) 93/55, pulse 77, temperature 97.8 F (36.6 C), temperature source Oral, resp. rate 16, height 5\' 2"  (1.575 m), weight 175 lb (79.4 kg), last menstrual period 11/21/2016, SpO2 99 %. General appearance: alert, cooperative and no distress Neck: no adenopathy, supple, symmetrical, trachea midline and thyroid not enlarged, symmetric, no tenderness/mass/nodules Back: symmetric, no curvature. ROM normal. No CVA tenderness. Resp: clear to auscultation bilaterally Cardio: regular rate and rhythm, S1, S2 normal, no murmur, click, rub or gallop GI: soft, non-tender; bowel sounds normal; no masses,  no organomegaly Pulses: 2+ and symmetric Skin: Skin color, texture, turgor normal. No rashes or lesions Neurologic: Grossly normal  Disposition: 01-Home or Self Care   Allergies as of 04/30/2017      Reactions   Diphenhydramine Hcl Shortness Of Breath, Palpitations   Unisom [doxylamine Succinate (sleep)]       Medication List    TAKE these medications   albuterol 108 (90 Base) MCG/ACT inhaler Commonly known as:  PROVENTIL HFA;VENTOLIN HFA Inhale 2 puffs into the lungs 2 (two) times daily.   cetirizine 10 MG tablet Commonly known as:  ZYRTEC Take 10 mg by mouth daily.   cetirizine 10 MG  tablet Commonly known as:  ZYRTEC Take two in the AM and two in the PM   ondansetron 4 MG tablet Commonly known as:  ZOFRAN Take 1 tablet (4 mg total) by mouth every 8 (eight) hours as needed for nausea or vomiting.     Also take Immodium as needed for frequent diarrhea   Signed: Letitia LibraRobert Paul Yani Lal 04/30/2017, 9:38 AM

## 2017-04-30 NOTE — Discharge Instructions (Signed)

## 2017-06-04 IMAGING — MR MR LUMBAR SPINE W/O CM
5 series · 39 of 48 positions shown · non-contrast
Comparison: None.

CLINICAL DATA: LEFT lower back pain for 1 day, radiating to LEFT
foot. Bladder pain with sitting. Urinary retention.

EXAM:
MRI LUMBAR SPINE WITHOUT CONTRAST
TECHNIQUE: Multiplanar, multisequence MR imaging of the lumbar spine was
performed. No intravenous contrast was administered.

[Series 3: T2 · sagittal · 4.0mm · 0.81mm/px · 6 of 15 slices shown (1 of 2)]
[im 1/15]
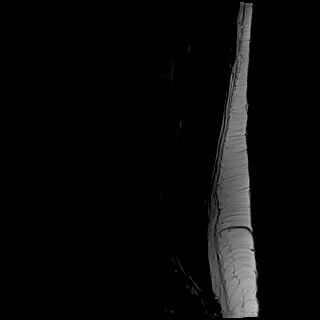
[im 3/15]
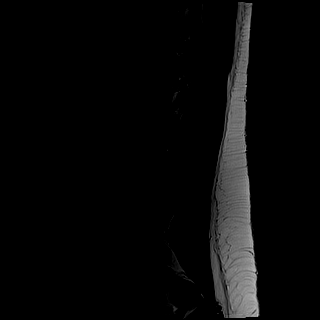
[im 6/15]
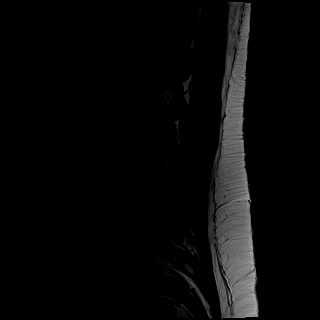
[im 9/15]
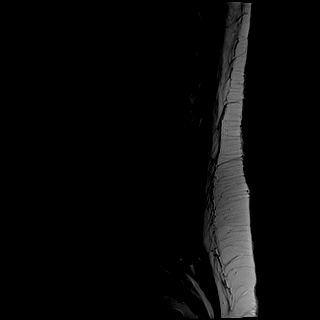
[im 12/15]
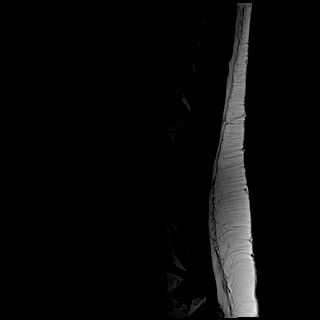
[im 15/15]
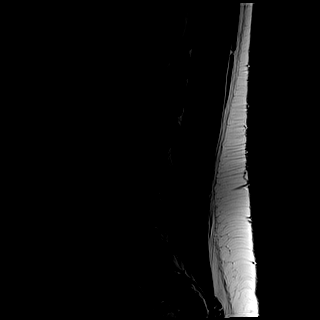

[Series 4: T1 · sagittal · 4.0mm · 0.81mm/px · 6 of 15 slices shown (1 of 2)]
[im 1/15]
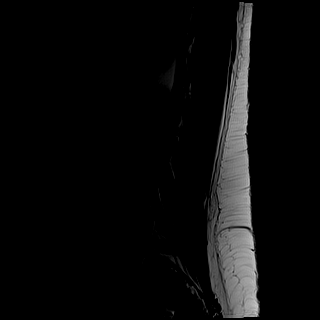
[im 3/15]
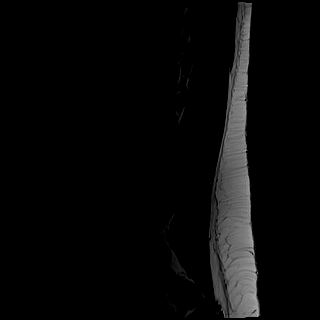
[im 6/15]
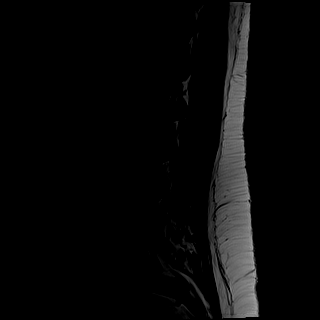
[im 9/15]
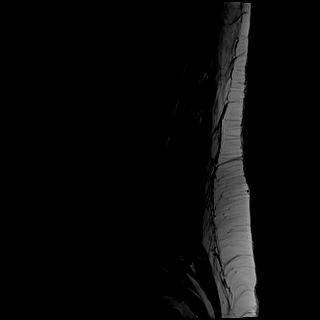
[im 12/15]
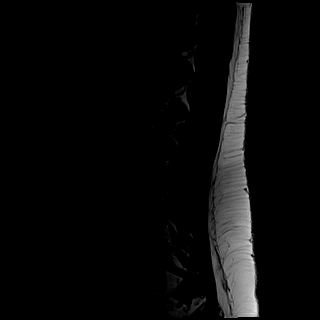
[im 15/15]
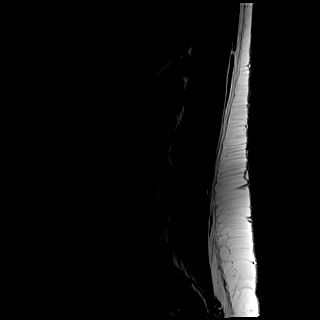

[Series 5: STIR · sagittal · 4.0mm · 1.02mm/px · 6 of 15 slices shown]
[im 1/15]
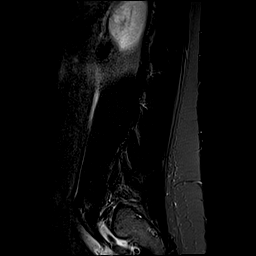
[im 3/15]
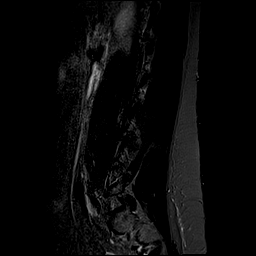
[im 6/15]
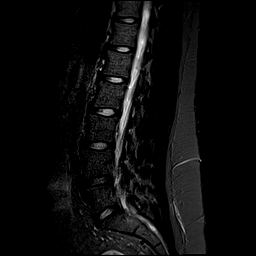
[im 9/15]
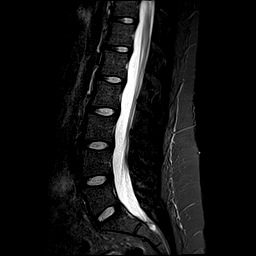
[im 12/15]
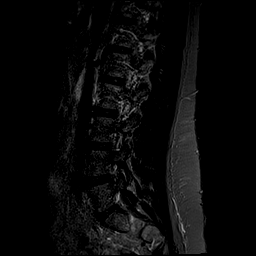
[im 15/15]
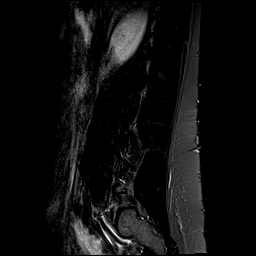

[Series 6: T2 · axial · 4.0mm · 0.78mm/px · z∈[-75,+144]mm · 12 of 38 slices shown (2 of 2)]
[im 1/38]
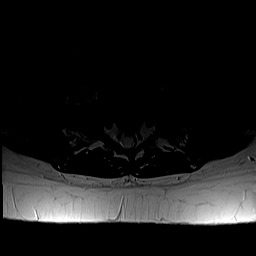
[im 3/38]
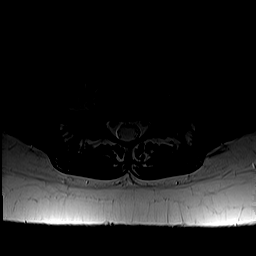
[im 6/38]
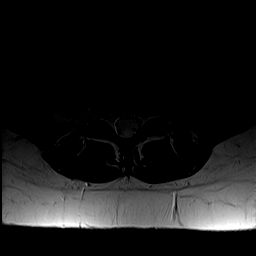
[im 8/38]
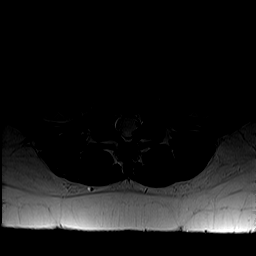
[im 11/38]
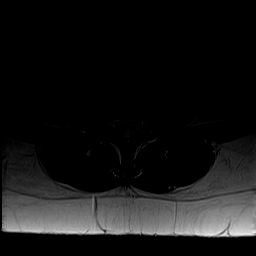
[im 14/38]
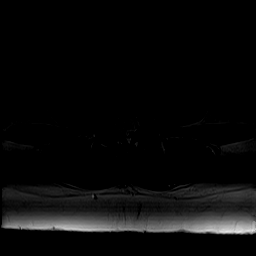
[im 16/38]
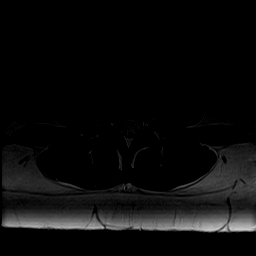
[im 19/38]
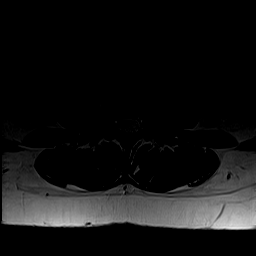
[im 22/38]
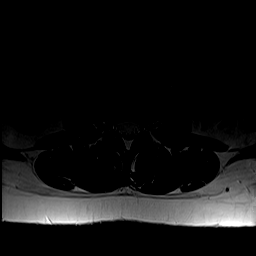
[im 27/38]
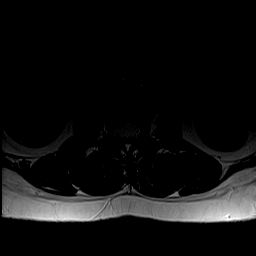
[im 32/38]
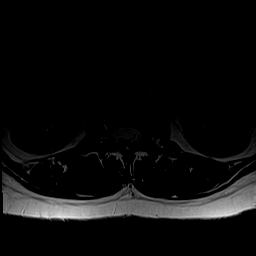
[im 38/38]
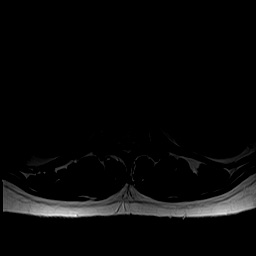

[Series 7: T1 · axial · 4.0mm · 0.39mm/px · z∈[-75,+144]mm · 9 of 38 slices shown (2 of 2)]
[im 1/38]
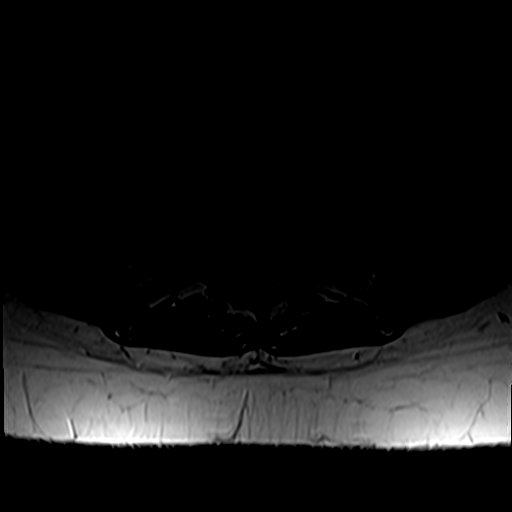
[im 6/38]
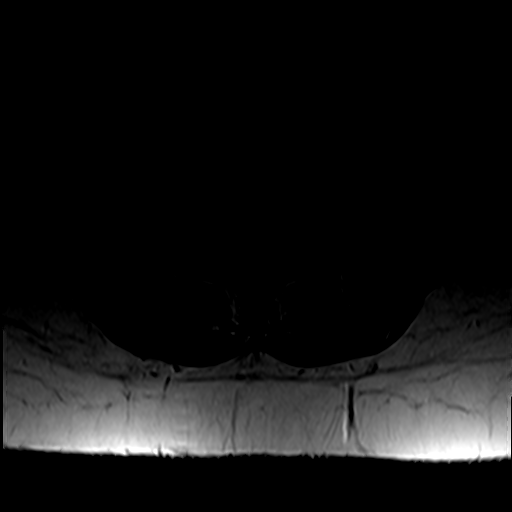
[im 11/38]
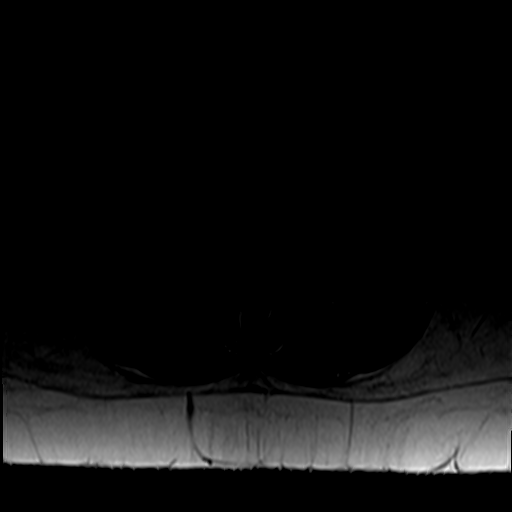
[im 16/38]
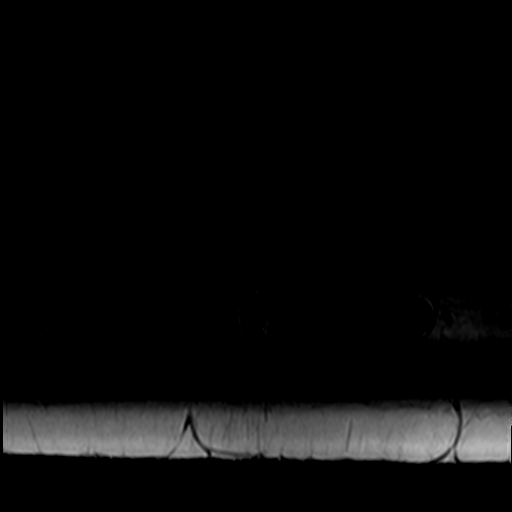
[im 19/38]
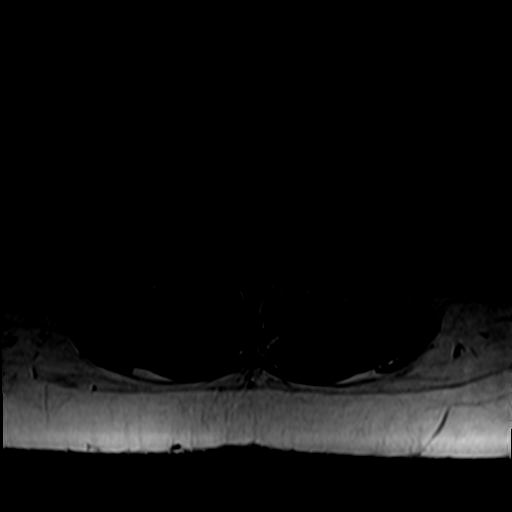
[im 22/38]
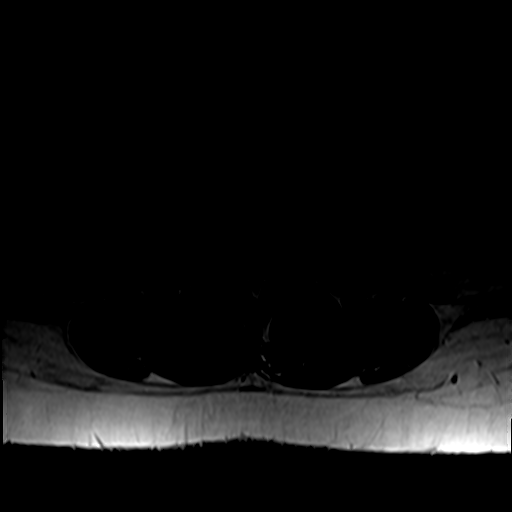
[im 27/38]
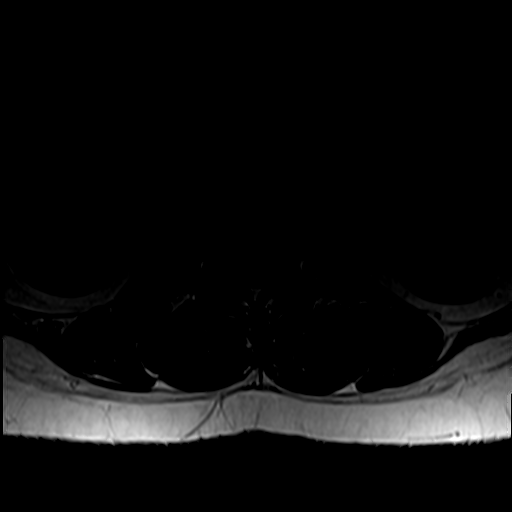
[im 32/38]
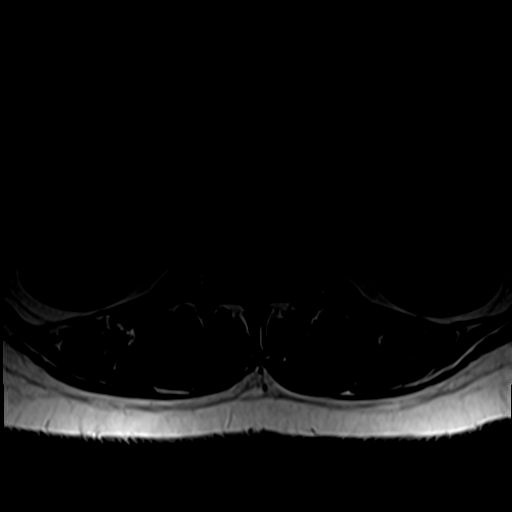
[im 38/38]
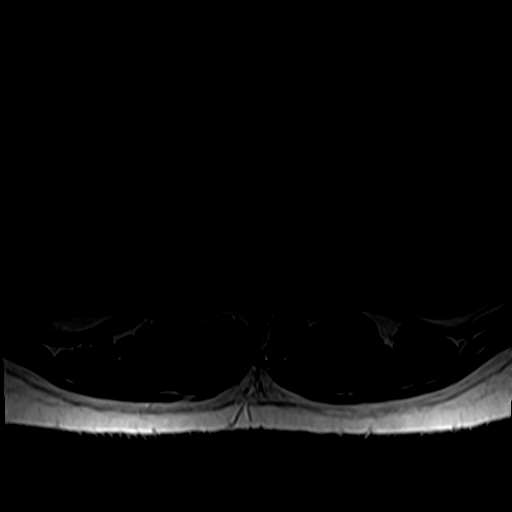

[39 of 48 positions shown; findings below may reference images not displayed]

FINDINGS: Lumbar vertebral bodies are intact and aligned with maintenance of
lumbar lordosis. Intervertebral discs demonstrate normal morphology
and signal characteristics. No abnormal bone marrow signal.

Conus medullaris terminates at L1-2 and demonstrates normal
morphology and signal characteristics. Cauda equina is normal.
Included prevertebral and paraspinal soft tissues are normal.

Level by level evaluation:

T12-L1 through L5-S1: No disc bulge, canal stenosis nor neural
foraminal narrowing.
IMPRESSION: Normal noncontrast MRI of the lumbar spine.

## 2017-06-23 ENCOUNTER — Other Ambulatory Visit: Payer: Self-pay | Admitting: Family Medicine

## 2017-06-23 DIAGNOSIS — O4423 Partial placenta previa NOS or without hemorrhage, third trimester: Secondary | ICD-10-CM

## 2017-07-05 ENCOUNTER — Ambulatory Visit
Admission: RE | Admit: 2017-07-05 | Discharge: 2017-07-05 | Disposition: A | Discharge status: Home / Self Care | Payer: Medicaid Other | Source: Ambulatory Visit | Attending: Family Medicine | Admitting: Family Medicine

## 2017-07-05 DIAGNOSIS — Z3A32 32 weeks gestation of pregnancy: Secondary | ICD-10-CM | POA: Diagnosis not present

## 2017-07-05 DIAGNOSIS — O4423 Partial placenta previa NOS or without hemorrhage, third trimester: Secondary | ICD-10-CM

## 2017-08-03 ENCOUNTER — Observation Stay
Admission: EM | Admit: 2017-08-03 | Discharge: 2017-08-03 | Disposition: A | Discharge status: Other Acute Inpatient Hospital | Payer: Medicaid Other | Attending: Obstetrics & Gynecology | Admitting: Obstetrics & Gynecology

## 2017-08-03 ENCOUNTER — Other Ambulatory Visit: Payer: Self-pay

## 2017-08-03 ENCOUNTER — Encounter: Payer: Self-pay | Admitting: Emergency Medicine

## 2017-08-03 DIAGNOSIS — Z3A36 36 weeks gestation of pregnancy: Secondary | ICD-10-CM | POA: Insufficient documentation

## 2017-08-03 DIAGNOSIS — R55 Syncope and collapse: Secondary | ICD-10-CM | POA: Insufficient documentation

## 2017-08-03 DIAGNOSIS — Z79899 Other long term (current) drug therapy: Secondary | ICD-10-CM | POA: Diagnosis not present

## 2017-08-03 DIAGNOSIS — K7689 Other specified diseases of liver: Secondary | ICD-10-CM | POA: Diagnosis not present

## 2017-08-03 DIAGNOSIS — Z3A37 37 weeks gestation of pregnancy: Secondary | ICD-10-CM | POA: Insufficient documentation

## 2017-08-03 DIAGNOSIS — J45909 Unspecified asthma, uncomplicated: Secondary | ICD-10-CM | POA: Insufficient documentation

## 2017-08-03 DIAGNOSIS — Z3493 Encounter for supervision of normal pregnancy, unspecified, third trimester: Secondary | ICD-10-CM | POA: Insufficient documentation

## 2017-08-03 DIAGNOSIS — O26893 Other specified pregnancy related conditions, third trimester: Principal | ICD-10-CM | POA: Insufficient documentation

## 2017-08-03 DIAGNOSIS — R079 Chest pain, unspecified: Secondary | ICD-10-CM | POA: Diagnosis not present

## 2017-08-03 LAB — CBC
HCT: 30.5 % — ABNORMAL LOW (ref 35.0–47.0)
HEMOGLOBIN: 10.8 g/dL — AB (ref 12.0–16.0)
MCH: 28.3 pg (ref 26.0–34.0)
MCHC: 35.4 g/dL (ref 32.0–36.0)
MCV: 80 fL (ref 80.0–100.0)
Platelets: 328 10*3/uL (ref 150–440)
RBC: 3.81 MIL/uL (ref 3.80–5.20)
RDW: 14.7 % — ABNORMAL HIGH (ref 11.5–14.5)
WBC: 11.5 10*3/uL — ABNORMAL HIGH (ref 3.6–11.0)

## 2017-08-03 LAB — URINALYSIS, COMPLETE (UACMP) WITH MICROSCOPIC
Bilirubin Urine: NEGATIVE
Glucose, UA: NEGATIVE mg/dL
HGB URINE DIPSTICK: NEGATIVE
Ketones, ur: NEGATIVE mg/dL
Nitrite: NEGATIVE
PROTEIN: NEGATIVE mg/dL
Specific Gravity, Urine: 1.008 (ref 1.005–1.030)
pH: 7 (ref 5.0–8.0)

## 2017-08-03 LAB — BASIC METABOLIC PANEL
ANION GAP: 9 (ref 5–15)
BUN: 8 mg/dL (ref 6–20)
CALCIUM: 8.8 mg/dL — AB (ref 8.9–10.3)
CO2: 22 mmol/L (ref 22–32)
Chloride: 106 mmol/L (ref 101–111)
Creatinine, Ser: 0.38 mg/dL — ABNORMAL LOW (ref 0.44–1.00)
GFR calc non Af Amer: >60 mL/min (ref >60)
Glucose, Bld: 90 mg/dL (ref 65–99)
Potassium: 3.3 mmol/L — ABNORMAL LOW (ref 3.5–5.1)
SODIUM: 137 mmol/L (ref 135–145)

## 2017-08-03 LAB — TROPONIN I

## 2017-08-03 MED ORDER — SOD CITRATE-CITRIC ACID 500-334 MG/5ML PO SOLN: 30.0000 mL | ORAL | Status: DC | PRN

## 2017-08-03 NOTE — Discharge Instructions (Signed)
KEEP NEXT SCHEDULED APPT AT [facility]  Call provider or return to birthplace with:  1. Regular contractions 2. Leaking of fluid from your vagina 3. Vaginal bleeding: Bright red or heavy like a period 4. Decreased Fetal movement

## 2017-08-03 NOTE — OB Triage Note (Signed)
Pt presents to L&D with c/o near syncopal epissode at 2100. Pt states she was feelin very dizzy and then felt like she was going to pass out and laid back. Pts husband stated he had a hard time getting her to respond to him and she says she doesn't think she completely passed out but remembers feeling like she couldn't keep her eyes open. Pt  also reports feeling short of breath and her "chest was heavy" when she was feeling like she was about to pass out. Pt denies pregnancy issues. Reports cramping irregularly earlier bt now is fine. Denies LOF or vaginal bleeding and reports good fetal movement.

## 2017-08-03 NOTE — ED Triage Notes (Signed)
Pt presents to ED from labor and delivery after she was monitored for the past hour. Pt states she had been sitting at home earlier this evening when she had a witnessed syncopal episode; pt husband was unable to awaken her for several minutes. Pt states she felt short of breath and chest heaviness during that time. Called her obgyn and they told her to come for further evaluation urgently. Pt currently [redacted] weeks pregnant. EDD 08/27/2017. Pt states a few days ago she had a severe headache for 3-4 days only on the right side of her head. Pt currently has clear speech and is able to answer questions appropriately. No increased work of breathing noted at this time.

## 2017-08-04 ENCOUNTER — Emergency Department
Admission: EM | Admit: 2017-08-04 | Discharge: 2017-08-04 | Disposition: A | Discharge status: Home / Self Care | Payer: Medicaid Other | Attending: Emergency Medicine | Admitting: Emergency Medicine

## 2017-08-04 ENCOUNTER — Emergency Department: Payer: Medicaid Other

## 2017-08-04 DIAGNOSIS — Z3493 Encounter for supervision of normal pregnancy, unspecified, third trimester: Secondary | ICD-10-CM

## 2017-08-04 DIAGNOSIS — K7689 Other specified diseases of liver: Secondary | ICD-10-CM

## 2017-08-04 DIAGNOSIS — R55 Syncope and collapse: Secondary | ICD-10-CM

## 2017-08-04 LAB — TROPONIN I

## 2017-08-04 MED ORDER — IOPAMIDOL (ISOVUE-370) INJECTION 76%
75.0000 mL | Freq: Once | INTRAVENOUS | Status: AC | PRN
  Administered 2017-08-04: 75 mL via INTRAVENOUS

## 2017-08-04 MED ORDER — SODIUM CHLORIDE 0.9 % IV BOLUS (SEPSIS)
1000.0000 mL | Freq: Once | INTRAVENOUS | Status: AC
  Administered 2017-08-04: 1000 mL via INTRAVENOUS

## 2017-08-04 NOTE — ED Notes (Signed)
Repeat troponin collected and sent to lab.

## 2017-08-04 NOTE — ED Notes (Signed)

## 2017-08-04 NOTE — ED Notes (Signed)
Assisted pt to ambulate from the stretcher to the commode and back to the stretcher; pt displays a steady, even, and balanced gait. No distress noted at this time.

## 2017-08-04 NOTE — ED Notes (Signed)
Called lab to inquire about the results of the repeat troponin; lab informed this RN, that they never received the blood work. Lab informed that I will recollect and send it again.

## 2017-08-04 NOTE — ED Notes (Signed)
Pt went to CT

## 2017-08-04 NOTE — ED Provider Notes (Signed)
[facility] Emergency Department Provider Note   ____________________________________________   First MD Initiated Contact with Patient 08/04/17 [phone]     (approximate)  I have reviewed the triage vital signs and the nursing notes.   HISTORY  Chief Complaint Chest Pain and Loss of Consciousness    HPI Courtney Sanders is a 26 y.o. female sent to the ED from labor and delivery for evaluation of near syncope, chest pain and shortness of breath. Patient is [redacted] weeks pregnant who was sitting at home when she felt very lightheaded and dizzy. Thinks she might have been stressed out because of her 3 kids. States she did not fully syncopize but felt like it. States at the time she felt short of breath with chest heaviness. She was monitored in labor and delivery for an hour; baby "checked out" and sent to the ED. Denies recent fever, chills, abdominal pain, nausea, vomiting, dysuria, vaginal discharge or bleeding. Denies recent travel or trauma. Other than admission for gastroenteritis and dehydration in her second trimester, patient has not experienced any prenatal complications.   Past Medical History:  Diagnosis Date  . Asthma   . Ovarian cyst     Patient Active Problem List   Diagnosis Date Noted  . Labor and delivery, indication for care 08/03/2017  . Noninfectious gastroenteritis and colitis 04/30/2017  . Nausea and vomiting during pregnancy 04/29/2017  . Paresthesias/numbness 07/17/2015  . Anemia 10/28/2014  . Atypical chest pain 10/28/2014  . Dyspnea, unspecified 10/28/2014  . Hx of ovarian cyst 10/28/2014    Past Surgical History:  Procedure Laterality Date  . CHOLECYSTECTOMY    . Nexplanon removal      Prior to Admission medications   Medication Sig Start Date End Date Taking? Authorizing Provider  albuterol (PROVENTIL HFA;VENTOLIN HFA) 108 (90 Base) MCG/ACT inhaler Inhale 2 puffs into the lungs 2 (two) times daily.   Yes  [provider]  cetirizine (ZYRTEC) 10 MG tablet Take 10 mg by mouth daily.   Yes [provider]    Allergies Diphenhydramine hcl and Unisom [doxylamine succinate (sleep)]  Family History  Problem Relation Age of Onset  . Asthma Mother     Social History Social History  Substance Use Topics  . Smoking status: Never Smoker  . Smokeless tobacco: Never Used  . Alcohol use Yes     Comment: 1 glass of wine per month    Review of Systems  Constitutional: No fever/chills. Eyes: No visual changes. ENT: No sore throat. Cardiovascular: positive for chest pain. Respiratory: positive for shortness of breath. Gastrointestinal: No abdominal pain.  No nausea, no vomiting.  No diarrhea.  No constipation. Genitourinary: Negative for dysuria. Musculoskeletal: Negative for back pain. Skin: Negative for rash. Neurological: positive for near-syncope. Negative for headaches, focal weakness or numbness.   ____________________________________________   PHYSICAL EXAM:  VITAL SIGNS: ED Triage Vitals  Enc Vitals Group     BP 08/04/17 0100 (!) 108/56     Pulse Rate 08/04/17 0101 85     Resp 08/04/17 0101 18     Temp 08/04/17 0217 97.6 F (36.4 C)     Temp Source 08/04/17 0217 Oral     SpO2 08/04/17 0101 98 %     Weight 08/03/17 2315 177 lb (80.3 kg)     Height 08/03/17 2315  (1.575 m)     Head Circumference --      Peak Flow --      Pain Score 08/03/17  2314 7     Pain Loc --      Pain Edu? --      Excl. in GC? --     Constitutional: Alert and oriented. Well appearing and in no acute distress. Eyes: Conjunctivae are normal. PERRL. EOMI. Head: Atraumatic. Nose: No congestion/rhinnorhea. Mouth/Throat: Mucous membranes are moist.  Oropharynx non-erythematous. Neck: No stridor.  no carotid bruits. Cardiovascular: Normal rate, regular rhythm. Grossly normal heart sounds.  Good peripheral circulation. Respiratory: Normal respiratory effort.  No retractions.  Lungs CTAB. Gastrointestinal: Soft and nontender. No distention. No abdominal bruits. No CVA tenderness. Musculoskeletal: No lower extremity tenderness nor edema.  No joint effusions. Neurologic:  Alert and oriented 3. CN II-XII  grossly intact. Normal speech and language. No gross focal neurologic deficits are appreciated. No gait instability. Skin:  Skin is warm, dry and intact. No rash noted. Psychiatric: Mood and affect are normal. Speech and behavior are normal.  ____________________________________________   LABS (all labs ordered are listed, but only abnormal results are displayed)  Labs Reviewed  BASIC METABOLIC PANEL - Abnormal; Notable for the following:       Result Value   Potassium 3.3 (*)    Creatinine, Ser 0.38 (*)    Calcium 8.8 (*)    All other components within normal limits  CBC - Abnormal; Notable for the following:    WBC 11.5 (*)    Hemoglobin 10.8 (*)    HCT 30.5 (*)    RDW 14.7 (*)    All other components within normal limits  URINALYSIS, COMPLETE (UACMP) WITH MICROSCOPIC - Abnormal; Notable for the following:    Color, Urine YELLOW (*)    APPearance CLEAR (*)    Leukocytes, UA TRACE (*)    Bacteria, UA RARE (*)    Squamous Epithelial / LPF 0-5 (*)    All other components within normal limits  TROPONIN I  TROPONIN I   ____________________________________________  EKG  ED ECG REPORT I, Caden Fukushima J, the attending physician, personally viewed and interpreted this ECG.   Date: 08/04/2017  EKG Time: 2312  Rate: 100  Rhythm: normal EKG, normal sinus rhythm  Axis: normal  Intervals:none  ST&T Change: Nonspecific No significant change from 2016  ____________________________________________  RADIOLOGY  Ct Angio Chest Pe W/cm &/or Wo Cm  Result Date: 08/04/2017 CLINICAL DATA:  Syncopal episode short of breath, [redacted] weeks pregnant EXAM: CT ANGIOGRAPHY CHEST WITH CONTRAST TECHNIQUE: Multidetector CT imaging of the chest was performed using the  standard protocol during bolus administration of intravenous contrast. Multiplanar CT image reconstructions and MIPs were obtained to evaluate the vascular anatomy. CONTRAST:  75 mL Isovue 370 intravenous COMPARISON:  None. FINDINGS: Cardiovascular: Suboptimal opacification of the pulmonary arterial system. No central or main segmental filling defect is seen to suggest an acute embolus. Nonaneurysmal aorta. No dissection. Normal heart size. No pericardial effusion. Mediastinum/Nodes: No enlarged mediastinal, hilar, or axillary lymph nodes. Thyroid gland, trachea, and esophagus demonstrate no significant findings. Lungs/Pleura: Lungs are clear. No pleural effusion or pneumothorax. Upper Abdomen: 12 mm hyperenhancing nodule within the anterior aspect of the liver. No acute abnormality. Musculoskeletal: No chest wall abnormality. No acute or significant osseous findings. Review of the MIP images confirms the above findings. IMPRESSION: 1. Suboptimal opacification of the pulmonary arterial system, no definite acute central embolus is seen. Negative for aortic dissection 2. Clear lung fields 3. 12 mm hyperenhancing nodule within the liver, possible flash hemangioma. Further evaluation with liver MRI is suggested; this may be performed  when clinically feasible. When the patient is clinically stable and able to follow directions and hold their breath (preferably as an outpatient) further evaluation with dedicated abdominal MRI should be considered. Electronically Signed   By: Jasmine Pang M.D.   On: 08/04/2017 02:58    ____________________________________________   PROCEDURES  Procedure(s) performed: None  Procedures  Critical Care performed: No  ____________________________________________   INITIAL IMPRESSION / ASSESSMENT AND PLAN / ED COURSE  Pertinent labs & imaging results that were available during my care of the patient were reviewed by me and considered in my medical decision making (see chart  for details).  26 year old female approximately [redacted] weeks pregnant sent by labor and delivery for evaluation of near syncopal episode with associated chest pain and shortness of breath. Differential diagnosis includes PE, dehydration, dissection. Will obtain orthostatic vital signs, check repeat troponin. Discussed with patient and her spouse at length the risk/benefits of CT scan to evaluate for pulmonary embolism. Patient agreeable for CT scan.  Clinical Course as of Aug 04 440  Thu Aug 04, 2017  0326 No orthostasis. Viewed CT images and updated patient and her spouse of CT results. She will follow up with liver MRI as an outpatient. Awaiting repeat troponin. Patient resting in no acute distress.  [JS]    Clinical Course User Index [JS] Irean Hong, MD     ____________________________________________   FINAL CLINICAL IMPRESSION(S) / ED DIAGNOSES  Final diagnoses:  Near syncope  Third trimester pregnancy  Liver nodule      NEW MEDICATIONS STARTED DURING THIS VISIT:  New Prescriptions   No medications on file     Note:  This document was prepared using Dragon voice recognition software and may include unintentional dictation errors.    Irean Hong, MD 08/04/17 [phone]

## 2017-08-04 NOTE — Discharge Instructions (Signed)
Please speak with your primary care doctor and your [facility] doctor regarding when to further evaluate the nodule seen on your liver. You will need an MRI to further evaluate this. Return to the ER for recurrent worsening symptoms, persistent vomiting, difficulty breathing or other concerns.

## 2017-08-09 NOTE — Discharge Summary (Signed)
Courtney Sanders is a 26 y.o. female. She is at [redacted]w[redacted]d gestation. Patient's last menstrual period was 11/21/2016 (exact date). Estimated Date of Delivery: 08/28/17  Prenatal care site:  [facility] Chief complaint: syncope  Context:  Patient was sitting, resting, and had near-syncopal episode with difficulty arousing patient.  She was sent immediately to L&D for eval and was not seen nor cleared by ED.  Since she is here, we will complete an eval for fetal well being but then send down to ED for comprehensive evaluation.  S: Resting comfortably. no CTX, no VB.no LOF,  Active fetal movement.   Maternal Medical History:   Past Medical History:  Diagnosis Date  . Asthma   . Ovarian cyst     Past Surgical History:  Procedure Laterality Date  . CHOLECYSTECTOMY    . Nexplanon removal      Allergies  Allergen Reactions  . Diphenhydramine Hcl Shortness Of Breath and Palpitations  . Unisom [Doxylamine Succinate (Sleep)]     Prior to Admission medications   Medication Sig Start Date End Date Taking? Authorizing Provider  albuterol (PROVENTIL HFA;VENTOLIN HFA) 108 (90 Base) MCG/ACT inhaler Inhale 2 puffs into the lungs 2 (two) times daily.   Yes [provider]  cetirizine (ZYRTEC) 10 MG tablet Take 10 mg by mouth daily.    [provider]     Social History: She  reports that she has never smoked. She has never used smokeless tobacco. She reports that she drinks alcohol. She reports that she does not use drugs.  Family History: family history includes Asthma in her mother.   Review of Systems: A full review of systems was performed and negative except as noted in the HPI.     O:  BP (!) 99/57   Pulse 85   Temp 98.5 F (36.9 C) (Oral)   Resp 18   Ht  (1.575 m)   Wt 80.3 kg (177 lb)   LMP 11/21/2016 (Exact Date)   BMI 32.37 kg/m  No results found for this or any previous visit (from the past 48 hour(s)).   Constitutional:  NAD, AAOx3  HE/ENT: extraocular movements grossly intact, moist mucous membranes CV: RRR PULM: nl respiratory effort, CTABL     Abd: gravid, non-tender, non-distended, soft      Ext: Non-tender, Nonedmeatous   Psych: mood appropriate, speech normal Pelvic deferred  NST:  Baseline:130  Variability: moderate Accelerations present x >2 Decelerations absent Time    A/P: 26 y.o. [redacted]w[redacted]d here for syncope/near-syncope  Labor: not present.   Fetal Wellbeing: Reassuring Cat 1 tracing.  Reactive NST   Send to ER for evaluation for syncopal episode and SOB  ----- Ranae Plumber, MD Attending Obstetrician and Gynecologist [facility], Department of OB/GYN [facility]

## 2017-08-11 ENCOUNTER — Ambulatory Visit
Admission: RE | Admit: 2017-08-11 | Discharge: 2017-08-11 | Disposition: A | Discharge status: Home / Self Care | Payer: Medicaid Other | Source: Ambulatory Visit | Attending: Family Medicine | Admitting: Family Medicine

## 2017-08-11 ENCOUNTER — Other Ambulatory Visit: Payer: Self-pay | Admitting: Family Medicine

## 2017-08-11 DIAGNOSIS — O26843 Uterine size-date discrepancy, third trimester: Secondary | ICD-10-CM | POA: Diagnosis present

## 2017-08-11 DIAGNOSIS — Z3A36 36 weeks gestation of pregnancy: Secondary | ICD-10-CM | POA: Diagnosis not present

## 2017-08-15 ENCOUNTER — Observation Stay
Admission: EM | Admit: 2017-08-15 | Discharge: 2017-08-15 | Disposition: A | Discharge status: Home / Self Care | Payer: Medicaid Other | Attending: Certified Nurse Midwife | Admitting: Certified Nurse Midwife

## 2017-08-15 DIAGNOSIS — O26893 Other specified pregnancy related conditions, third trimester: Principal | ICD-10-CM | POA: Insufficient documentation

## 2017-08-15 DIAGNOSIS — Z3A38 38 weeks gestation of pregnancy: Secondary | ICD-10-CM | POA: Diagnosis not present

## 2017-08-15 DIAGNOSIS — O4290 Premature rupture of membranes, unspecified as to length of time between rupture and onset of labor, unspecified weeks of gestation: Secondary | ICD-10-CM | POA: Diagnosis present

## 2017-08-15 HISTORY — DX: Allergic rhinitis, unspecified: J30.9

## 2017-08-15 HISTORY — DX: Iron deficiency anemia, unspecified: D50.9

## 2017-08-15 NOTE — Discharge Summary (Signed)
Patient off the monitors, dressed and ready for discharge at start of my shift. Reviewed discharge instructions with patient. Went over signs and symptoms of false/true labor and when to come back in. Gave patient opportunity for questions. Patient discharged home with significant other.

## 2017-08-15 NOTE — Final Progress Note (Addendum)
Physician Final Progress Note  Patient ID: Courtney Sanders MRN: [id number] DOB/AGE: 11-Dec-1990 25 y.o.  Admit date: 08/15/2017 Admitting provider: Jill Side L. Sharen Hones, CNM Discharge date: 08/15/2017   Admission Diagnoses: Leakage of fluid IUP at 38wk1d  Discharge Diagnoses:  IUP at 38wk1d with no evidence of SROM  Consults: None  Significant Findings/ Diagnostic Studies: 26 yo G4 P3003 with EDC=September 25, 2017 by LMP presents to L&D with c/o leakage of fluid at 1800 last night prior to and during a shower. Had just urinated when she felt a trickle of fluid from vagina that "smelled sweet." Last IC was yesterday prior to leakage. Has been having some blood tinged mucous intermittently since last week. Was 2 cm dilated at her prenatal visit 3 days ago. Receives her prenatal care from [facility] which has been remarkable for mild intermittent asthma, concerns regarding a low lying placenta with the last ultrasound in August showing the placental edge 2cm from the os, and anemia. Uses her Albuterol inhaler intermittently. Taking iron and vitamin supplements Records from [facility] were reviewed, but are not current. O POS/ RI/ VI  Past Medical History:  Diagnosis Date  . Allergic rhinitis   . Asthma   . Iron deficiency anemia   . Ovarian cyst    Past Surgical History:  Procedure Laterality Date  . CHOLECYSTECTOMY    . Nexplanon removal      Social History   Social History  . Marital status: Married    Spouse name: N/A  . Number of children: 3  . Years of education: N/A   Social History Main Topics  . Smoking status: Never Smoker  . Smokeless tobacco: Never Used  . Alcohol use No     Comment: not with pregnancy  . Drug use: No  . Sexual activity: Yes    Partners: Male    Birth control/ protection: None   Other Topics Concern  . None   Social History Narrative   ** Merged History Encounter **       Exam: BP (!) 102/59 (BP Location: Left Arm)    Pulse 80   Temp 98.3 F (36.8 C) (Oral)   Resp 18   Ht  (1.575 m)   Wt 80.3 kg (177 lb)   LMP 11/21/2016 (Exact Date)   BMI 32.37 kg/m   General: Hispanic female in NAD Abdomen: Uterus NT, Leopold's: cephalic FHR: 125-130 baseline with accelerations to 150s-160s, moderate variability Toco: irregular contractions on arrival, then no contractions over the last 30-45 minutes SSE: Vulva: no lesions or inflammation Vagina: thick mucoid  sl green discharge Nitrazine neg, fern neg, pooling neg Wet prep: negative for Trich, clue cells, or hyphae Cervix: 3cm/50%/-1 to -2. Blood on glove after exam Ultrasound: posterior placenta, fetal head seems to be lower than edge of placenta Presentation: cephalic/ LOA  A: IUP at 38.1 weeks with no evidence of SROM/ not in labor Friable cervix FWB: Cat 1 tracing  P: DC home with labor precautions [facility]  Addendum: Patient leaked a little blood when she was discharged originally and she thought she had SROM'd She walked and was monitored for 2 more hours, but there was no further leakage. She reported more contractions after walking, but cervix had not changed. FHR strip remained 3/50% there was brown mucoid discharge on the examining glove. She was discharged home with labor precautions  Procedures: none  Discharge Condition: stable  Disposition: 01-Home or Self Care  Diet: Regular diet  Discharge Activity: Activity as tolerated  Discharge Instructions    Discharge patient    Complete by:  As directed    Discharge disposition:  01-Home or Self Care   Discharge patient date:  08/15/2017     Allergies as of 08/15/2017      Reactions   Diphenhydramine Hcl Shortness Of Breath, Palpitations   Unisom [doxylamine Succinate (sleep)]       Medication List    TAKE these medications   albuterol 108 (90 Base) MCG/ACT inhaler Commonly known as:  PROVENTIL HFA;VENTOLIN HFA Inhale 2 puffs into the lungs 2 (two) times  daily.   cetirizine 10 MG tablet Commonly known as:  ZYRTEC Take 10 mg by mouth daily.   ferrous sulfate 325 (65 FE) MG EC tablet Take 325 mg by mouth 2 (two) times daily after a meal.   multivitamin-prenatal 27-0.8 MG Tabs tablet Take 1 tablet by mouth daily at 12 noon.            Discharge Care Instructions        Start     Ordered   08/15/17 0000  Discharge patient    Question Answer Comment  Discharge disposition 01-Home or Self Care   Discharge patient date 08/15/2017      08/15/17 0334       Total time spent taking care of this patient: 30 minutes  Signed: Farrel Conners 08/15/2017, 3:37 AM

## 2017-08-18 ENCOUNTER — Inpatient Hospital Stay
Admission: EM | Admit: 2017-08-18 | Discharge: 2017-08-20 | DRG: 798 | Disposition: A | Discharge status: Home / Self Care | Payer: Medicaid Other | Attending: Obstetrics & Gynecology | Admitting: Obstetrics & Gynecology

## 2017-08-18 DIAGNOSIS — Z8279 Family history of other congenital malformations, deformations and chromosomal abnormalities: Secondary | ICD-10-CM

## 2017-08-18 DIAGNOSIS — J45909 Unspecified asthma, uncomplicated: Secondary | ICD-10-CM | POA: Diagnosis present

## 2017-08-18 DIAGNOSIS — O9952 Diseases of the respiratory system complicating childbirth: Secondary | ICD-10-CM | POA: Diagnosis present

## 2017-08-18 DIAGNOSIS — Z3A38 38 weeks gestation of pregnancy: Secondary | ICD-10-CM

## 2017-08-18 DIAGNOSIS — Z302 Encounter for sterilization: Secondary | ICD-10-CM

## 2017-08-18 DIAGNOSIS — O9902 Anemia complicating childbirth: Secondary | ICD-10-CM | POA: Diagnosis present

## 2017-08-18 DIAGNOSIS — D509 Iron deficiency anemia, unspecified: Secondary | ICD-10-CM | POA: Diagnosis present

## 2017-08-18 DIAGNOSIS — J4530 Mild persistent asthma, uncomplicated: Secondary | ICD-10-CM | POA: Diagnosis present

## 2017-08-18 NOTE — OB Triage Note (Signed)
Pt presents to L&D with c/o contractions since 4pm. Was seen at [facility] today and was 3 cm, and stated the provider stripped her membranes. Pt denies leaking fluid, vaginal bleeding and reports good fetal movement. EFM and toco applied and expalined. Plan to monitor fetal and maternal well being and assess for labor.

## 2017-08-19 ENCOUNTER — Inpatient Hospital Stay: Payer: Medicaid Other | Admitting: Anesthesiology

## 2017-08-19 ENCOUNTER — Encounter
Admission: EM | Disposition: A | Discharge status: Home / Self Care | Payer: Self-pay | Source: Home / Self Care | Attending: Obstetrics & Gynecology

## 2017-08-19 ENCOUNTER — Inpatient Hospital Stay: Payer: Medicaid Other | Admitting: Registered Nurse

## 2017-08-19 ENCOUNTER — Encounter: Payer: Self-pay | Admitting: Anesthesiology

## 2017-08-19 DIAGNOSIS — D509 Iron deficiency anemia, unspecified: Secondary | ICD-10-CM | POA: Diagnosis present

## 2017-08-19 DIAGNOSIS — Z8279 Family history of other congenital malformations, deformations and chromosomal abnormalities: Secondary | ICD-10-CM

## 2017-08-19 DIAGNOSIS — J45909 Unspecified asthma, uncomplicated: Secondary | ICD-10-CM | POA: Diagnosis present

## 2017-08-19 DIAGNOSIS — O9902 Anemia complicating childbirth: Secondary | ICD-10-CM | POA: Diagnosis present

## 2017-08-19 DIAGNOSIS — O26893 Other specified pregnancy related conditions, third trimester: Secondary | ICD-10-CM | POA: Diagnosis present

## 2017-08-19 DIAGNOSIS — O9952 Diseases of the respiratory system complicating childbirth: Secondary | ICD-10-CM | POA: Diagnosis present

## 2017-08-19 DIAGNOSIS — Z3A38 38 weeks gestation of pregnancy: Secondary | ICD-10-CM | POA: Diagnosis not present

## 2017-08-19 DIAGNOSIS — J4530 Mild persistent asthma, uncomplicated: Secondary | ICD-10-CM | POA: Diagnosis present

## 2017-08-19 DIAGNOSIS — Z302 Encounter for sterilization: Secondary | ICD-10-CM | POA: Diagnosis not present

## 2017-08-19 HISTORY — PX: TUBAL LIGATION: SHX77

## 2017-08-19 LAB — TYPE AND SCREEN
ABO/RH(D): O POS
ANTIBODY SCREEN: NEGATIVE

## 2017-08-19 LAB — CBC
HCT: 30.8 % — ABNORMAL LOW (ref 35.0–47.0)
HEMOGLOBIN: 10.6 g/dL — AB (ref 12.0–16.0)
MCH: 27.4 pg (ref 26.0–34.0)
MCHC: 34.3 g/dL (ref 32.0–36.0)
MCV: 79.9 fL — ABNORMAL LOW (ref 80.0–100.0)
PLATELETS: 267 10*3/uL (ref 150–440)
RBC: 3.86 MIL/uL (ref 3.80–5.20)
RDW: 15.3 % — ABNORMAL HIGH (ref 11.5–14.5)
WBC: 10.2 10*3/uL (ref 3.6–11.0)

## 2017-08-19 SURGERY — LIGATION, FALLOPIAN TUBE, POSTPARTUM
Anesthesia: Spinal | Site: Abdomen | Wound class: Clean Contaminated

## 2017-08-19 MED ORDER — PRENATAL MULTIVITAMIN CH
1.0000 | ORAL_TABLET | Freq: Every day | ORAL | Status: DC
  Administered 2017-08-20: 1 via ORAL
  Filled 2017-08-19: qty 1

## 2017-08-19 MED ORDER — BUPIVACAINE IN DEXTROSE 0.75-8.25 % IT SOLN
INTRATHECAL | Status: AC
  Filled 2017-08-19: qty 2

## 2017-08-19 MED ORDER — OXYCODONE HCL 5 MG PO TABS
5.0000 mg | ORAL_TABLET | ORAL | Status: DC | PRN
  Administered 2017-08-19 – 2017-08-20 (×3): 5 mg via ORAL
  Filled 2017-08-19 (×3): qty 1

## 2017-08-19 MED ORDER — LIDOCAINE HCL (PF) 1 % IJ SOLN
INTRAMUSCULAR | Status: AC
  Filled 2017-08-19: qty 30

## 2017-08-19 MED ORDER — AMMONIA AROMATIC IN INHA
RESPIRATORY_TRACT | Status: AC
  Filled 2017-08-19: qty 10

## 2017-08-19 MED ORDER — ACETAMINOPHEN 500 MG PO TABS
ORAL_TABLET | ORAL | Status: AC
  Filled 2017-08-19: qty 2

## 2017-08-19 MED ORDER — BENZOCAINE-MENTHOL 20-0.5 % EX AERO: 1.0000 "application " | INHALATION_SPRAY | CUTANEOUS | Status: DC | PRN

## 2017-08-19 MED ORDER — OXYTOCIN BOLUS FROM INFUSION
500.0000 mL | Freq: Once | INTRAVENOUS | Status: AC
  Administered 2017-08-19: 500 mL via INTRAVENOUS

## 2017-08-19 MED ORDER — BUPIVACAINE HCL 0.5 % IJ SOLN
INTRAMUSCULAR | Status: DC | PRN
  Administered 2017-08-19: 20 mL

## 2017-08-19 MED ORDER — ONDANSETRON HCL 4 MG/2ML IJ SOLN: 4.0000 mg | INTRAMUSCULAR | Status: DC | PRN

## 2017-08-19 MED ORDER — PROPOFOL 10 MG/ML IV BOLUS
INTRAVENOUS | Status: AC
  Filled 2017-08-19: qty 20

## 2017-08-19 MED ORDER — BUPIVACAINE HCL (PF) 0.25 % IJ SOLN
INTRAMUSCULAR | Status: DC | PRN
Start: 2017-08-19 — End: 2017-08-19
  Administered 2017-08-19 (×2): 4 mL via EPIDURAL

## 2017-08-19 MED ORDER — FENTANYL 2.5 MCG/ML W/ROPIVACAINE 0.15% IN NS 100 ML EPIDURAL (ARMC)
EPIDURAL | Status: AC
Start: 2017-08-19 — End: 2017-08-19
  Filled 2017-08-19: qty 100

## 2017-08-19 MED ORDER — EPINEPHRINE PF 1 MG/ML IJ SOLN
INTRAMUSCULAR | Status: DC | PRN
  Administered 2017-08-19: .0001 mL via INTRATHECAL

## 2017-08-19 MED ORDER — ACETAMINOPHEN 325 MG PO TABS: 650.0000 mg | ORAL_TABLET | ORAL | Status: DC | PRN

## 2017-08-19 MED ORDER — EPHEDRINE 5 MG/ML INJ
INTRAVENOUS | Status: AC
  Administered 2017-08-19: 5 mg
  Filled 2017-08-19: qty 4

## 2017-08-19 MED ORDER — OXYTOCIN 10 UNIT/ML IJ SOLN
INTRAMUSCULAR | Status: AC
  Filled 2017-08-19: qty 2

## 2017-08-19 MED ORDER — MISOPROSTOL 200 MCG PO TABS
ORAL_TABLET | ORAL | Status: AC
  Filled 2017-08-19: qty 4

## 2017-08-19 MED ORDER — LACTATED RINGERS IV SOLN
INTRAVENOUS | Status: DC
  Administered 2017-08-19 (×2): via INTRAVENOUS

## 2017-08-19 MED ORDER — LIDOCAINE HCL (PF) 1 % IJ SOLN
INTRAMUSCULAR | Status: DC | PRN
Start: 2017-08-19 — End: 2017-08-19
  Administered 2017-08-19: 3 mL via SUBCUTANEOUS

## 2017-08-19 MED ORDER — FENTANYL 2.5 MCG/ML W/ROPIVACAINE 0.15% IN NS 100 ML EPIDURAL (ARMC)
EPIDURAL | Status: DC | PRN
  Administered 2017-08-19: 12 mL/h via EPIDURAL

## 2017-08-19 MED ORDER — FAMOTIDINE IN NACL 20-0.9 MG/50ML-% IV SOLN
20.0000 mg | Freq: Once | INTRAVENOUS | Status: AC
  Administered 2017-08-19: 20 mg via INTRAVENOUS
  Filled 2017-08-19: qty 50

## 2017-08-19 MED ORDER — LIDOCAINE-EPINEPHRINE (PF) 1.5 %-1:200000 IJ SOLN
INTRAMUSCULAR | Status: DC | PRN
  Administered 2017-08-19: 3 mL via EPIDURAL

## 2017-08-19 MED ORDER — FENTANYL CITRATE (PF) 100 MCG/2ML IJ SOLN
INTRAMUSCULAR | Status: AC
  Filled 2017-08-19: qty 2

## 2017-08-19 MED ORDER — CEFAZOLIN SODIUM 1 G IJ SOLR
INTRAMUSCULAR | Status: AC
  Filled 2017-08-19: qty 10

## 2017-08-19 MED ORDER — FENTANYL CITRATE (PF) 100 MCG/2ML IJ SOLN
25.0000 ug | INTRAMUSCULAR | Status: AC | PRN
  Administered 2017-08-19 (×6): 25 ug via INTRAVENOUS

## 2017-08-19 MED ORDER — WITCH HAZEL-GLYCERIN EX PADS
1.0000 "application " | MEDICATED_PAD | CUTANEOUS | Status: DC
  Administered 2017-08-19: 1 via TOPICAL

## 2017-08-19 MED ORDER — FENTANYL CITRATE (PF) 100 MCG/2ML IJ SOLN
INTRAMUSCULAR | Status: AC
  Administered 2017-08-19: 25 ug via INTRAVENOUS
  Filled 2017-08-19: qty 2

## 2017-08-19 MED ORDER — MIDAZOLAM HCL 5 MG/5ML IJ SOLN
INTRAMUSCULAR | Status: DC | PRN
  Administered 2017-08-19: 2 mg via INTRAVENOUS

## 2017-08-19 MED ORDER — ONDANSETRON HCL 4 MG/2ML IJ SOLN
4.0000 mg | Freq: Four times a day (QID) | INTRAMUSCULAR | Status: DC | PRN
  Administered 2017-08-19: 4 mg via INTRAVENOUS
  Filled 2017-08-19: qty 2

## 2017-08-19 MED ORDER — ONDANSETRON HCL 4 MG PO TABS: 4.0000 mg | ORAL_TABLET | ORAL | Status: DC | PRN

## 2017-08-19 MED ORDER — SOD CITRATE-CITRIC ACID 500-334 MG/5ML PO SOLN
30.0000 mL | ORAL | Status: DC | PRN
  Filled 2017-08-19: qty 15

## 2017-08-19 MED ORDER — BUPIVACAINE IN DEXTROSE 0.75-8.25 % IT SOLN
INTRATHECAL | Status: DC | PRN
  Administered 2017-08-19: 2 mL via INTRATHECAL

## 2017-08-19 MED ORDER — OXYCODONE HCL 5 MG PO TABS
10.0000 mg | ORAL_TABLET | ORAL | Status: DC | PRN
  Administered 2017-08-20 (×4): 10 mg via ORAL
  Filled 2017-08-19 (×4): qty 2

## 2017-08-19 MED ORDER — OXYTOCIN 40 UNITS IN LACTATED RINGERS INFUSION - SIMPLE MED
2.5000 [IU]/h | INTRAVENOUS | Status: DC
  Filled 2017-08-19: qty 1000

## 2017-08-19 MED ORDER — EPHEDRINE 5 MG/ML INJ
5.0000 mg | Freq: Once | INTRAVENOUS | Status: AC
  Administered 2017-08-19: 5 mg via INTRAVENOUS
  Filled 2017-08-19: qty 1

## 2017-08-19 MED ORDER — SIMETHICONE 80 MG PO CHEW
80.0000 mg | CHEWABLE_TABLET | ORAL | Status: DC | PRN
  Administered 2017-08-20: 80 mg via ORAL
  Filled 2017-08-19: qty 1

## 2017-08-19 MED ORDER — DOCUSATE SODIUM 100 MG PO CAPS
100.0000 mg | ORAL_CAPSULE | Freq: Two times a day (BID) | ORAL | Status: DC
  Administered 2017-08-19 – 2017-08-20 (×3): 100 mg via ORAL
  Filled 2017-08-19 (×3): qty 1

## 2017-08-19 MED ORDER — OXYTOCIN 40 UNITS IN LACTATED RINGERS INFUSION - SIMPLE MED
1.0000 m[IU]/min | INTRAVENOUS | Status: DC
  Administered 2017-08-19: 100 mL via INTRAVENOUS
  Administered 2017-08-19: 2 m[IU]/min via INTRAVENOUS
  Filled 2017-08-19: qty 1000

## 2017-08-19 MED ORDER — IBUPROFEN 400 MG PO TABS: 600.0000 mg | ORAL_TABLET | Freq: Four times a day (QID) | ORAL | Status: DC

## 2017-08-19 MED ORDER — COCONUT OIL OIL
1.0000 "application " | TOPICAL_OIL | Status: DC | PRN
  Administered 2017-08-20: 1 via TOPICAL
  Filled 2017-08-19: qty 120

## 2017-08-19 MED ORDER — HYDROCORTISONE 2.5 % RE CREA
TOPICAL_CREAM | RECTAL | Status: DC
  Filled 2017-08-19: qty 28.35

## 2017-08-19 MED ORDER — ACETAMINOPHEN 500 MG PO TABS
1000.0000 mg | ORAL_TABLET | Freq: Four times a day (QID) | ORAL | Status: DC | PRN
  Administered 2017-08-20: 1000 mg via ORAL
  Filled 2017-08-19: qty 2

## 2017-08-19 MED ORDER — EPHEDRINE 5 MG/ML INJ: 10.0000 mg | Freq: Once | INTRAVENOUS | Status: DC

## 2017-08-19 MED ORDER — SODIUM CHLORIDE FLUSH 0.9 % IV SOLN
INTRAVENOUS | Status: AC
  Filled 2017-08-19: qty 10

## 2017-08-19 MED ORDER — ONDANSETRON HCL 4 MG/2ML IJ SOLN: 4.0000 mg | Freq: Once | INTRAMUSCULAR | Status: DC | PRN

## 2017-08-19 MED ORDER — LACTATED RINGERS IV SOLN
500.0000 mL | INTRAVENOUS | Status: DC | PRN
  Administered 2017-08-19 (×2): 500 mL via INTRAVENOUS

## 2017-08-19 MED ORDER — LIDOCAINE HCL (PF) 1 % IJ SOLN: 30.0000 mL | INTRAMUSCULAR | Status: DC | PRN

## 2017-08-19 MED ORDER — IBUPROFEN 400 MG PO TABS
600.0000 mg | ORAL_TABLET | Freq: Four times a day (QID) | ORAL | Status: DC
  Administered 2017-08-19 – 2017-08-20 (×5): 600 mg via ORAL
  Filled 2017-08-19 (×5): qty 1

## 2017-08-19 MED ORDER — SOD CITRATE-CITRIC ACID 500-334 MG/5ML PO SOLN
30.0000 mL | Freq: Once | ORAL | Status: AC
Start: 2017-08-19 — End: 2017-08-19
  Administered 2017-08-19: 30 mL via ORAL

## 2017-08-19 MED ORDER — ACETAMINOPHEN 500 MG PO TABS
1000.0000 mg | ORAL_TABLET | Freq: Once | ORAL | Status: AC
  Administered 2017-08-19: 1000 mg via ORAL

## 2017-08-19 MED ORDER — MIDAZOLAM HCL 2 MG/2ML IJ SOLN
INTRAMUSCULAR | Status: AC
  Filled 2017-08-19: qty 2

## 2017-08-19 SURGICAL SUPPLY — 31 items
BLADE SURG SZ11 CARB STEEL (BLADE) ×3 IMPLANT
CATH TRAY METER 16FR LF (MISCELLANEOUS) ×3 IMPLANT
CHLORAPREP W/TINT 26ML (MISCELLANEOUS) ×3 IMPLANT
DERMABOND ADVANCED (GAUZE/BANDAGES/DRESSINGS) ×2
DERMABOND ADVANCED .7 DNX12 (GAUZE/BANDAGES/DRESSINGS) ×1 IMPLANT
DRAPE LAPAROTOMY 100X77 ABD (DRAPES) ×3 IMPLANT
DRSG TEGADERM 2-3/8X2-3/4 SM (GAUZE/BANDAGES/DRESSINGS) ×3 IMPLANT
DRSG TELFA 4X3 1S NADH ST (GAUZE/BANDAGES/DRESSINGS) ×3 IMPLANT
ELECT CAUTERY BLADE 6.4 (BLADE) ×3 IMPLANT
ELECT REM PT RETURN 9FT ADLT (ELECTROSURGICAL) ×3
ELECTRODE REM PT RTRN 9FT ADLT (ELECTROSURGICAL) ×1 IMPLANT
GLOVE PI ORTHOPRO 6.5 (GLOVE) ×2
GLOVE PI ORTHOPRO STRL 6.5 (GLOVE) ×1 IMPLANT
GLOVE SURG SYN 6.5 ES PF (GLOVE) ×3 IMPLANT
GOWN STRL REUS W/ TWL LRG LVL3 (GOWN DISPOSABLE) ×2 IMPLANT
GOWN STRL REUS W/TWL LRG LVL3 (GOWN DISPOSABLE) ×4
KIT RM TURNOVER CYSTO AR (KITS) ×3 IMPLANT
LABEL OR SOLS (LABEL) ×3 IMPLANT
LIGASURE BLUNT 5MM 37CM (INSTRUMENTS) IMPLANT
NDL SAFETY 22GX1.5 (NEEDLE) ×3 IMPLANT
NS IRRIG 500ML POUR BTL (IV SOLUTION) ×3 IMPLANT
PACK BASIN MINOR ARMC (MISCELLANEOUS) ×3 IMPLANT
RETRACTOR WOUND ALXS 18CM SML (MISCELLANEOUS) ×1 IMPLANT
RTRCTR WOUND ALEXIS O 18CM SML (MISCELLANEOUS) ×3
SPONGE LAP 18X18 5 PK (GAUZE/BANDAGES/DRESSINGS) ×3 IMPLANT
SURGIFLO W/THROMBIN 8M KIT (HEMOSTASIS) ×3 IMPLANT
SUT MNCRL AB 4-0 PS2 18 (SUTURE) ×3 IMPLANT
SUT VIC AB 2-0 CT1 27 (SUTURE) ×4
SUT VIC AB 2-0 CT1 TAPERPNT 27 (SUTURE) ×2 IMPLANT
SUT VIC AB 2-0 UR6 27 (SUTURE) ×3 IMPLANT
SYRINGE 10CC LL (SYRINGE) ×3 IMPLANT

## 2017-08-19 NOTE — Progress Notes (Signed)
OR nurse called and report given prior to patient transport to surgery.

## 2017-08-19 NOTE — Anesthesia Procedure Notes (Addendum)
Spinal  Patient location during procedure: OR Start time: 08/19/2017 5:31 PM End time: 08/19/2017 5:33 PM Staffing Anesthesiologist: Martha Clan Resident/CRNA: Rolla Plate Performed: resident/CRNA  Preanesthetic Checklist Completed: patient identified, site marked, surgical consent, pre-op evaluation, timeout performed, IV checked, risks and benefits discussed and monitors and equipment checked Spinal Block Patient position: sitting Prep: ChloraPrep and site prepped and draped Patient monitoring: heart rate, continuous pulse ox, blood pressure and cardiac monitor Approach: midline Location: L3-4 Injection technique: single-shot Needle Needle type: Introducer and Pencan  Needle gauge: 24 G Needle length: 9 cm Assessment Sensory level: T4 Additional Notes Negative paresthesia. Negative blood return. Positive free-flowing CSF. Expiration date of kit checked and confirmed. Patient tolerated procedure well, without complications.

## 2017-08-19 NOTE — Progress Notes (Signed)
Intrapartum progress note:  S: comfortable, tolerating contractions  O: BP (!) 84/58   Pulse 81   Temp 98 F (36.7 C) (Oral)   Resp 18   Ht  (1.575 m)   Wt 81.6 kg (180 lb)   LMP 11/21/2016 (Exact Date)   BMI 32.92 kg/m   FHT: 110 mod + accels no decels TOCO: q5-6 min SVE: 6/70/-1, AROM'd for clear fluid  A/P: 96EA [id number] @ 38+5 with Labor  1. IUP: category 1 2. Expectant management after AROM  ----- Ranae Plumber, MD Attending Obstetrician and Gynecologist [facility], Department of OB/GYN [facility]

## 2017-08-19 NOTE — Transfer of Care (Signed)
Immediate Anesthesia Transfer of Care Note  Patient: Courtney Sanders  Procedure(s) Performed: POST PARTUM TUBAL LIGATION (N/A Abdomen)  Patient Location: PACU  Anesthesia Type:Spinal  Level of Consciousness: awake, alert  and oriented  Airway & Oxygen Therapy: Patient Spontanous Breathing  Post-op Assessment: Post -op Vital signs reviewed and stable  Post vital signs: stable  Last Vitals:  Vitals:   08/19/17 1531 08/19/17 1921  BP: (!) 98/50 95/76  Pulse: 65 96  Resp: 14 20  Temp: (!) 36.4 C   SpO2:  100%    Last Pain:  Vitals:   08/19/17 1630  TempSrc:   PainSc: 5       Patients Stated Pain Goal: 0 (08/19/17 1630)  Complications: No apparent anesthesia complications

## 2017-08-19 NOTE — Anesthesia Procedure Notes (Signed)
Epidural Patient location during procedure: OB Start time: 08/19/2017 7:57 AM End time: 08/19/2017 8:10 AM  Staffing Anesthesiologist: Berdine Addison Resident/CRNA: Karoline Caldwell Performed: resident/CRNA   Preanesthetic Checklist Completed: patient identified, site marked, surgical consent, pre-op evaluation, timeout performed, IV checked, risks and benefits discussed and monitors and equipment checked  Epidural Patient position: sitting Prep: Betadine Patient monitoring: heart rate, continuous pulse ox and blood pressure Approach: midline Location: L4-L5 Injection technique: LOR saline  Needle:  Needle type: Tuohy  Needle gauge: 17 G Needle length: 9 cm and 9 Needle insertion depth: 5 cm Catheter type: closed end flexible Catheter size: 19 Gauge Catheter at skin depth: 10 cm Test dose: negative and 1.5% lidocaine with Epi 1:200 K  Assessment Events: blood not aspirated, injection not painful, no injection resistance, negative IV test and no paresthesia  Additional Notes Pt. Evaluated and documentation done after procedure finished. Patient identified. Risks/Benefits/Options discussed with patient including but not limited to bleeding, infection, nerve damage, paralysis, failed block, incomplete pain control, headache, blood pressure changes, nausea, vomiting, reactions to medication both or allergic, itching and postpartum back pain. Confirmed with bedside nurse the patient's most recent platelet count. Confirmed with patient that they are not currently taking any anticoagulation, have any bleeding history or any family history of bleeding disorders. Patient expressed understanding and wished to proceed. All questions were answered. Sterile technique was used throughout the entire procedure. Please see nursing notes for vital signs. Test dose was given through epidural catheter and negative prior to continuing to dose epidural or start infusion. Warning signs of high block given to  the patient including shortness of breath, tingling/numbness in hands, complete motor block, or any concerning symptoms with instructions to call for help. Patient was given instructions on fall risk and not to get out of bed. All questions and concerns addressed with instructions to call with any issues or inadequate analgesia.   Patient tolerated the insertion well without immediate complications.Reason for block:procedure for pain

## 2017-08-19 NOTE — H&P (Signed)
OB History & Physical   History of Present Illness:  Chief Complaint:   HPI:  Courtney Sanders is a 26 y.o. [phone] female at [redacted]w[redacted]d dated by LMP c/w 13wk [place]. Patient's last menstrual period was 11/21/2016 (exact date). Estimated Date of Delivery: 08/28/17  She presents to L&D with persistently worsening contractions, made change from 3 to 5cm while in obs in triage.   +FM, + CTX, no LOF, no VB  Pregnancy Issues: 1. Asthma, mild persistent (Qvar + Albuterol) 2. Hx Anemia 3. Family history of mental retardation and congenital anomalies: brother is deaf and partially blind, had surgery as an infant   Maternal Medical History:   Past Medical History:  Diagnosis Date  . Allergic rhinitis   . Asthma   . Iron deficiency anemia   . Ovarian cyst     Past Surgical History:  Procedure Laterality Date  . CHOLECYSTECTOMY    . Nexplanon removal      Allergies  Allergen Reactions  . Diphenhydramine Hcl Shortness Of Breath and Palpitations  . Unisom [Doxylamine Succinate (Sleep)]     Prior to Admission medications   Medication Sig Start Date End Date Taking? Authorizing Provider  albuterol (PROVENTIL HFA;VENTOLIN HFA) 108 (90 Base) MCG/ACT inhaler Inhale 2 puffs into the lungs 2 (two) times daily.   Yes [provider]  ferrous sulfate 325 (65 FE) MG EC tablet Take 325 mg by mouth 2 (two) times daily after a meal.    Yes [provider]  Prenatal Vit-Fe Fumarate-FA (MULTIVITAMIN-PRENATAL) 27-0.8 MG TABS tablet Take 1 tablet by mouth daily at 12 noon.   Yes [provider]  cetirizine (ZYRTEC) 10 MG tablet Take 10 mg by mouth daily.    [provider]     Prenatal care site: [facility]  Social History: She  reports that she has never smoked. She has never used smokeless tobacco. She reports that she does not drink alcohol or use drugs.  Family History: family history includes Asthma in her mother; Diabetes in her maternal  grandfather and maternal uncle; Hypertension in her maternal grandfather.   Review of Systems: A full review of systems was performed and negative except as noted in the HPI.     Physical Exam:  Vital Signs: BP (!) 84/58   Pulse 81   Temp 98 F (36.7 C) (Oral)   Resp 18   Ht  (1.575 m)   Wt 81.6 kg (180 lb)   LMP 11/21/2016 (Exact Date)   BMI 32.92 kg/m  General: no acute distress.  HEENT: normocephalic, atraumatic Heart: regular rate & rhythm.  No murmurs/rubs/gallops Lungs: clear to auscultation bilaterally, normal respiratory effort Abdomen: soft, gravid, non-tender;  EFW: 7.13 Pelvic:   External: Normal external female genitalia  Cervix: Dilation: 5 / Effacement (%): 60 / Station: -2    Extremities: non-tender, symmetric, 1+ edema bilaterally.  DTRs: 2+  Neurologic: Alert & oriented x 3.    Results for orders placed or performed during the hospital encounter of 08/18/17 (from the past 24 hour(s))  Type and screen [facility]     Status: None (Preliminary result)   Collection Time: 08/19/17 12:31 AM  Result Value Ref Range   ABO/RH(D) PENDING    Antibody Screen PENDING    Sample Expiration 08/22/2017   CBC     Status: Abnormal   Collection Time: 08/19/17  3:03 AM  Result Value Ref Range   WBC 10.2 3.6 - 11.0 K/uL  RBC 3.86 3.80 - 5.20 MIL/uL   Hemoglobin 10.6 (L) 12.0 - 16.0 g/dL   HCT 81.1 (L) 91.4 - 78.2 %   MCV 79.9 (L) 80.0 - 100.0 fL   MCH 27.4 26.0 - 34.0 pg   MCHC 34.3 32.0 - 36.0 g/dL   RDW 95.6 (H) 21.3 - 08.6 %   Platelets 267 150 - 440 K/uL  Type and screen     Status: None   Collection Time: 08/19/17  3:03 AM  Result Value Ref Range   ABO/RH(D) O POS    Antibody Screen NEG    Sample Expiration 08/22/2017     Pertinent Results:  Prenatal Labs: Blood type/Rh O+  Antibody screen neg  Rubella Immune  Varicella Immune  RPR NR  HBsAg Neg  HIV NR  GC neg  Chlamydia neg  Genetic screening negative  1 hour GTT Not in  records  3 hour GTT   GBS Negative - called [facility]   FHT:110 mod + accels no decels TOCO: q4-78min SVE:  Dilation: 5 / Effacement (%): 60 / Station: -2    Cephalic by leopolds  US Ob Follow Up  Result Date: 08/11/2017 CLINICAL DATA:  Third trimester uterine size discrepancy. EXAM: OBSTETRIC 14+ WK [place] FOLLOW-UP AND TRANSVAGINAL OB US FINDINGS: Number of Fetuses: 1 Heart Rate:  130 bpm Movement: Present Presentation: Cephalic Previa: None visualized. Lower margin of the placenta and cervix not visualized due to gestational age . Placental Location: Posterior Amniotic Fluid (Subjective): Amniotic Fluid (Objective): AFI 15.9 cm (5%ile= 7.5 cm, 95%= 24.4 cm for 37 wks) FETAL BIOMETRY BPD:  8.5cm 34w 2d HC:    31.0cm  34w   5d AC:   36.5cm  40w   3d FL:   7.3cm  37w   4d Current Mean GA: 36w 6d              [place] EDC: 08/23/2017 Estimated Fetal Weight:  3,445g    77%ile FETAL ANATOMY Lateral Ventricles: Visualized Thalami/CSP: Visualized Posterior Fossa:  Visualized Nuchal Region: Previously visualized scratched Upper Lip: Previously visualized Spine: Visualized 4 Chamber Heart on Left: Visualized LVOT: Previously visualized RVOT: Previously visualized Stomach on Left: Visualized 3 Vessel Cord: Previously visualized Cord Insertion site: Previously visualized Kidneys: Visualized Bladder: Visualized Extremities: Previously visualize Technically difficult due to: Maternal Findings: Cervix:  Not visualized IMPRESSION: Lower margin of the placenta and cervix not visualize due to advanced gestational age. Single viable intrauterine pregnancy at 36 weeks 6 days. Cephalic presentation Electronically Signed   By: Maisie Fus  Register   On: 08/11/2017 16:44    Assessment:  Courtney Sanders is a 26 y.o. [phone] female at [redacted]w[redacted]d with Labor.   Plan:  1. Admit to Labor & Delivery 2. CBC, T&S, Clrs, IVF 3. GBS negative   4. Consents obtained. 5. Continuous efm/toco 6. Category 1 7. Expectant management;  AROM or augment with Pitocin PRN  ----- Ranae Plumber, MD Attending Obstetrician and Gynecologist [facility], Department of OB/GYN [facility]

## 2017-08-19 NOTE — Op Note (Signed)
  Post Partum Tubal Ligation  Indication for procedure: desired permanent sterilization  Pre-op diagnosis: s/p term vaginal delivery, desired permanent sterilization Post-op diagnosis: same Procedure: post partum bilateral tubal ligation Surgeon: Maurion Walkowiak Assist: none Anesthesia: general  IVF: 900cc EBL: 125cc UOP: 100cc Findings: patent bilateral tubes, normal post-gravid uterine fundus Specimens: portion of left tube, portion of right tube Complications: none apparent Disposition: stable to PACU  Procedure in detail: The patient was seen and confirmed desire for permanent sterilization.  She was identified as Courtney Sanders and was brought to the OR.  Spinal anesthesia was administered, and the patient was prepped and draped in the usual sterile fashion.  A surgical time-out was called.  An 11-blade was used to incise the skin in the infraumbilical area.  The subcutaneous tissues were dissected to and then through the fascia, and the peritoneum was grasped and divided.  An alexis retractor was placed in the abdominal cavity and positioned.  The uterus was identified, and the left tube was grasped with a babcock and traced to the fimbriated end.  The mesosalpinx was undermined, kelly clamps were applied, and metzenbaums were used to transect the tube from the underlying tissue.  2-0 Vicryl was used to tie the remaining tissue.  The same steps were used on the right side.  The mesosalpinx edges on the left were briskly bleeding, a vessel was identified and ligated with several 2-0 vicryl figure-of-eights.  The area was covered with surgi-flo and observed for hemostasis.  After the appropriate time, the surgiflo was irrigated and the sites were hemostatic.  The alexis retractor was removed, and the fascia was reapproximated with 2-0 vicryl.  The subcutaneous tissue was irrigated and then the skin was closed with 4-0 monocryl.  The skin was covered in surgical glue.  The  sponge, needle, and instrument counts were correct x2.  The patient tolerated the procedure and was brought to PACU in a stable condition.  Ranae Plumber, MD Attending Obstetrician and Gynecologist [facility]

## 2017-08-19 NOTE — Anesthesia Post-op Follow-up Note (Signed)
Anesthesia QCDR form completed.        

## 2017-08-19 NOTE — Discharge Summary (Signed)
Obstetrical Discharge Summary  Patient Name: Courtney Sanders DOB: 10-May-1991 MRN: [id number]  Date of Admission: 08/18/2017 Date of Delivery: 08/19/17 Delivered by: Ward Date of Discharge: 08/20/17 Primary OB: [facility] [id number] last menstrual period was 11/21/2016 (exact date). EDC Estimated Date of Delivery: 08/28/17 Gestational Age at Delivery: [redacted]w[redacted]d   Antepartum complications:  1. Asthma, mild persistent (Qvar + Albuterol) 2. Hx Anemia 3. Family history of mental retardation and congenital anomalies: brother is deaf and partially blind, had surgery as an infant Admitting Diagnosis: Labor @ term Secondary Diagnosis: Patient Active Problem List   Diagnosis Date Noted  . Asthma 08/19/2017  . Family history of congenital anomalies 08/19/2017  . Labor and delivery, indication for care 08/03/2017  . Anemia 10/28/2014    Augmentation: AROM and Pitocin Complications: None Intrapartum complications/course: Mom presented to L&D with labor, AROM'd, augmented with pitocin.  epidual placed. Progressed to complete, second stage: <60min, with delivery of fetal head with restitution to LOT   Anterior then posterior shoulders delivered without difficulty.  Baby placed on mom's chest, and attended to by peds.  We sang happy birthday to baby [place].  Cord was then clamped and cut by FOB.  Placenta spontaneously delivered, intact.   IV pitocin given for hemorrhage prophylaxis. Perineal 2nd degree repaired in standard fashion. Date of Delivery: 08/19/17 Delivered By: Leeroy Bock Ward Delivery Type: spontaneous vaginal delivery Anesthesia: epidural Placenta: sponatneous Laceration: 2nd degree Episiotomy: none Newborn Data: Live born female  Birth Weight: 7 lb 9 oz (3430 g) APGAR: 8, 9  Newborn Delivery   Birth date/time:  08/19/2017 13:10:00 Delivery type:  Vaginal, Spontaneous Delivery      Postpartum Procedures: PPTL  Post partum course:  Patient had an uncomplicated  postpartum course.  By time of discharge on PPD#1, her pain was controlled on oral pain medications; she had appropriate lochia and was ambulating, voiding without difficulty and tolerating regular diet.  She was deemed stable for discharge to home.     Discharge Physical Exam: 08/20/2017  BP (!) 92/44 (BP Location: Left Arm) Comment: notify Kendra, Rn of bp  Pulse 60   Temp 98.3 F (36.8 C) (Oral)   Resp 18   Ht  (1.575 m)   Wt 81.6 kg (180 lb)   LMP 11/21/2016 (Exact Date)   SpO2 98%   Breastfeeding? Unknown   BMI 32.92 kg/m   General: NAD CV: RRR Pulm: CTABL, nl effort ABD: s/nd/nt, fundus firm and below the umbilicus Lochia: moderate Incision: c/d/i DVT Evaluation: LE non-ttp, no evidence of DVT on exam.  Hemoglobin  Date Value Ref Range Status  08/20/2017 10.5 (L) 12.0 - 16.0 g/dL Final   HGB  Date Value Ref Range Status  02/23/2015 12.8 12.0 - 16.0 g/dL Final   HCT  Date Value Ref Range Status  08/20/2017 31.0 (L) 35.0 - 47.0 % Final  02/23/2015 38.4 35.0 - 47.0 % Final     Disposition: stable, discharge to home. Baby Feeding: breastmilk Baby Disposition: home with mom  Rh Immune globulin given: n/a Rubella vaccine given: n/a Tdap vaccine given in AP or PP setting: AP Flu vaccine given in AP or PP setting: declined  Contraception: PPTL  Prenatal Labs:  Blood type/Rh O+  Antibody screen neg  Rubella Immune  Varicella Immune  RPR NR  HBsAg Neg  HIV NR  GC neg  Chlamydia neg  Genetic screening negative  1 hour GTT Not in records  3 hour GTT   GBS Negative -  called [facility]      Plan:  Raziyah Finlee Concepcion was discharged to home in good condition. Follow-up appointment with Dr. Elesa Massed in 6 weeks.  Discharge Medications: Allergies as of 08/20/2017      Reactions   Diphenhydramine Hcl Shortness Of Breath, Palpitations   Unisom [doxylamine Succinate (sleep)]       Medication List    STOP taking these medications   ferrous  sulfate 325 (65 FE) MG EC tablet     TAKE these medications   albuterol 108 (90 Base) MCG/ACT inhaler Commonly known as:  PROVENTIL HFA;VENTOLIN HFA Inhale 2 puffs into the lungs 2 (two) times daily.   cetirizine 10 MG tablet Commonly known as:  ZYRTEC Take 10 mg by mouth daily.   HYDROcodone-acetaminophen 5-325 MG tablet Commonly known as:  NORCO Take 1 tablet by mouth every 4 (four) hours as needed for moderate pain.   ibuprofen 600 MG tablet Commonly known as:  ADVIL,MOTRIN Take 1 tablet (600 mg total) by mouth every 6 (six) hours.   multivitamin-prenatal 27-0.8 MG Tabs tablet Take 1 tablet by mouth daily at 12 noon.       Follow-up Information    Ward, Elenora Fender, MD Follow up in 2 week(s).   Specialty:  Obstetrics and Gynecology Why:  post op check  Contact information: [address] [facility] [place] [address] [phone]        [facility] Follow up in 6 week(s).   Specialty:  General Practice Why:  postpartum  Contact information: [address] [place] [address] [phone]           Signed: Shella Spearing sSchermerhorn MD  Hit refresh and delete this line

## 2017-08-19 NOTE — Care Management (Signed)
Rate was reduced from 12 to 6.

## 2017-08-19 NOTE — Care Management (Signed)
Called for hypotension. Pump cut off. Fluids given. Head down. Ephedrine . Doing well.No rash or throat swelling. Will follow closely.

## 2017-08-19 NOTE — Anesthesia Preprocedure Evaluation (Signed)
Anesthesia Evaluation  Patient identified by MRN, date of birth, ID band Patient awake    Reviewed: Allergy & Precautions, H&P , NPO status , Patient's Chart, lab work & pertinent test results  History of Anesthesia Complications Negative for: history of anesthetic complications  Airway Mallampati: III       Dental  (+) Teeth Intact, Dental Advidsory Given   Pulmonary neg shortness of breath, asthma , neg recent URI,           Cardiovascular negative cardio ROS       Neuro/Psych negative neurological ROS  negative psych ROS   GI/Hepatic Neg liver ROS, GERD  ,  Endo/Other  negative endocrine ROS  Renal/GU negative Renal ROS  negative genitourinary   Musculoskeletal   Abdominal   Peds  Hematology  (+) anemia ,   Anesthesia Other Findings Past Medical History: No date: Allergic rhinitis No date: Asthma No date: Iron deficiency anemia No date: Ovarian cyst   Reproductive/Obstetrics (+) Breast feeding                              Anesthesia Physical  Anesthesia Plan  ASA: II  Anesthesia Plan: Spinal   Post-op Pain Management:    Induction:   PONV Risk Score and Plan: 2 and Ondansetron and Dexamethasone  Airway Management Planned: Simple Face Mask  Additional Equipment:   Intra-op Plan:   Post-operative Plan:   Informed Consent: I have reviewed the patients History and Physical, chart, labs and discussed the procedure including the risks, benefits and alternatives for the proposed anesthesia with the patient or authorized representative who has indicated his/her understanding and acceptance.     Plan Discussed with: Anesthesiologist and CRNA  Anesthesia Plan Comments:         Anesthesia Quick Evaluation

## 2017-08-19 NOTE — Care Management (Signed)
Pt. dopng well. BPs from 80's to 90's. Will start pump as pitocin will be started soon.

## 2017-08-19 NOTE — Anesthesia Preprocedure Evaluation (Addendum)
Anesthesia Evaluation  Patient identified by MRN, date of birth, ID band Patient awake    Reviewed: Allergy & Precautions, H&P , NPO status , Patient's Chart, lab work & pertinent test results  Airway Mallampati: III       Dental  (+) Teeth Intact   Pulmonary asthma ,           Cardiovascular negative cardio ROS       Neuro/Psych negative neurological ROS  negative psych ROS   GI/Hepatic Neg liver ROS, GERD  ,  Endo/Other  negative endocrine ROS  Renal/GU negative Renal ROS  negative genitourinary   Musculoskeletal   Abdominal   Peds  Hematology  (+) anemia ,   Anesthesia Other Findings   Reproductive/Obstetrics (+) Pregnancy                            Anesthesia Physical Anesthesia Plan  ASA: II  Anesthesia Plan: Epidural   Post-op Pain Management:    Induction:   PONV Risk Score and Plan:   Airway Management Planned:   Additional Equipment:   Intra-op Plan:   Post-operative Plan:   Informed Consent: I have reviewed the patients History and Physical, chart, labs and discussed the procedure including the risks, benefits and alternatives for the proposed anesthesia with the patient or authorized representative who has indicated his/her understanding and acceptance.     Plan Discussed with: Anesthesiologist and CRNA  Anesthesia Plan Comments:         Anesthesia Quick Evaluation

## 2017-08-20 LAB — CBC
HCT: 31 % — ABNORMAL LOW (ref 35.0–47.0)
Hemoglobin: 10.5 g/dL — ABNORMAL LOW (ref 12.0–16.0)
MCH: 27.1 pg (ref 26.0–34.0)
MCHC: 33.8 g/dL (ref 32.0–36.0)
MCV: 80.3 fL (ref 80.0–100.0)
PLATELETS: 253 10*3/uL (ref 150–440)
RBC: 3.86 MIL/uL (ref 3.80–5.20)
RDW: 15.1 % — ABNORMAL HIGH (ref 11.5–14.5)
WBC: 11.7 10*3/uL — ABNORMAL HIGH (ref 3.6–11.0)

## 2017-08-20 LAB — RPR: RPR Ser Ql: NONREACTIVE

## 2017-08-20 MED ORDER — HYDROCODONE-ACETAMINOPHEN 5-325 MG PO TABS: 1.0000 | ORAL_TABLET | ORAL | 0 refills | Status: DC | PRN

## 2017-08-20 MED ORDER — IBUPROFEN 600 MG PO TABS: 600.0000 mg | ORAL_TABLET | Freq: Four times a day (QID) | ORAL | 0 refills | Status: DC

## 2017-08-20 NOTE — Discharge Summary (Signed)
Discharge and follow up reviewed and signed with pt. Prescriptions received on day shift. Pt verbalizes understanding of all dc instructions. Pt will remain in room with infant who is still a pt.

## 2017-08-20 NOTE — Anesthesia Postprocedure Evaluation (Signed)
Anesthesia Post Note  Patient: Courtney Sanders  Procedure(s) Performed: AN AD HOC LABOR EPIDURAL  Patient location during evaluation: Mother Baby Anesthesia Type: Epidural Level of consciousness: awake and alert and oriented Pain management: pain level controlled Vital Signs Assessment: post-procedure vital signs reviewed and stable Respiratory status: spontaneous breathing Cardiovascular status: blood pressure returned to baseline Postop Assessment: no headache and no backache Anesthetic complications: no     Last Vitals:  Vitals:   08/20/17 0729 08/20/17 0928  BP: (!) 92/44   Pulse: 60   Resp: 18   Temp: 36.8 C   SpO2: 98% 98%    Last Pain:  Vitals:   08/20/17 0928  TempSrc:   PainSc: 8                  Thersia Petraglia

## 2017-08-20 NOTE — Anesthesia Postprocedure Evaluation (Signed)
Anesthesia Post Note  Patient: Courtney Sanders  Procedure(s) Performed: POST PARTUM TUBAL LIGATION (N/A Abdomen)  Patient location during evaluation: Mother Baby Anesthesia Type: Spinal Level of consciousness: awake and alert and oriented Pain management: pain level controlled Vital Signs Assessment: post-procedure vital signs reviewed and stable Respiratory status: spontaneous breathing Cardiovascular status: blood pressure returned to baseline Anesthetic complications: no     Last Vitals:  Vitals:   08/20/17 0729 08/20/17 0928  BP: (!) 92/44   Pulse: 60   Resp: 18   Temp: 36.8 C   SpO2: 98% 98%    Last Pain:  Vitals:   08/20/17 0928  TempSrc:   PainSc: 8                  Kya Mayfield

## 2017-08-22 ENCOUNTER — Encounter: Payer: Self-pay | Admitting: Obstetrics & Gynecology

## 2017-08-23 LAB — SURGICAL PATHOLOGY

## 2017-10-05 ENCOUNTER — Other Ambulatory Visit: Payer: Self-pay | Admitting: Family Medicine

## 2017-10-05 DIAGNOSIS — K7689 Other specified diseases of liver: Secondary | ICD-10-CM

## 2017-10-19 ENCOUNTER — Ambulatory Visit: Payer: Medicaid Other

## 2017-10-27 ENCOUNTER — Ambulatory Visit
Admission: RE | Admit: 2017-10-27 | Discharge: 2017-10-27 | Disposition: A | Discharge status: Home / Self Care | Payer: Medicaid Other | Source: Ambulatory Visit | Attending: Family Medicine | Admitting: Family Medicine

## 2017-10-27 DIAGNOSIS — K7689 Other specified diseases of liver: Secondary | ICD-10-CM | POA: Diagnosis not present

## 2017-10-27 MED ORDER — GADOBENATE DIMEGLUMINE 529 MG/ML IV SOLN
15.0000 mL | Freq: Once | INTRAVENOUS | Status: AC | PRN
  Administered 2017-10-27: 15 mL via INTRAVENOUS

## 2019-10-18 ENCOUNTER — Other Ambulatory Visit: Payer: Self-pay

## 2019-10-18 ENCOUNTER — Emergency Department
Admission: EM | Admit: 2019-10-18 | Discharge: 2019-10-18 | Disposition: A | Discharge status: Home / Self Care | Payer: Medicaid Other | Attending: Emergency Medicine | Admitting: Emergency Medicine

## 2019-10-18 ENCOUNTER — Encounter: Payer: Self-pay | Admitting: Emergency Medicine

## 2019-10-18 DIAGNOSIS — Z79899 Other long term (current) drug therapy: Secondary | ICD-10-CM | POA: Insufficient documentation

## 2019-10-18 DIAGNOSIS — J45909 Unspecified asthma, uncomplicated: Secondary | ICD-10-CM | POA: Insufficient documentation

## 2019-10-18 DIAGNOSIS — J039 Acute tonsillitis, unspecified: Secondary | ICD-10-CM

## 2019-10-18 LAB — GROUP A STREP BY PCR: Group A Strep by PCR: NOT DETECTED

## 2019-10-18 MED ORDER — AMOXICILLIN 500 MG PO CAPS: 500.0000 mg | ORAL_CAPSULE | Freq: Three times a day (TID) | ORAL | 0 refills | Status: DC

## 2019-10-18 NOTE — ED Triage Notes (Signed)
Pt reports sore throat and fever that started yesterday.  

## 2019-10-18 NOTE — Discharge Instructions (Addendum)
Follow-up with your primary care provider if any continued problems.  Also schedule appointment with Dr. Pryor Ochoa who is the doctor at [facility] if this continues and you continue to want to have your tonsils removed.  You may take Tylenol or ibuprofen as needed for pain and for fever.  Take the antibiotic for the next 10 days until completely finished.  Continue to drink fluids and eat soft foods as tolerated.  Drink fluids frequently to avoid getting dehydrated.

## 2019-10-18 NOTE — ED Notes (Signed)
See triage note  Presents with sore throat which started yesterday  Afebrile on arrival

## 2019-10-18 NOTE — ED Provider Notes (Signed)
[facility] Emergency Department Provider Note  ____________________________________________   First MD Initiated Contact with Patient 10/18/19 1059     (approximate)  I have reviewed the triage vital signs and the nursing notes.   HISTORY  Chief Complaint Sore Throat and Fever   HPI Courtney Sanders is a 28 y.o. female Courtney Sanders to the ED with complaint of sore throat that started yesterday.  Patient states that she also had a fever.  She states that swallowing is painful.  She denies any exposure to Covid and no one in her house is sick at this time.  Rates her pain as a 10/10.     Past Medical History:  Diagnosis Date  . Allergic rhinitis   . Asthma   . Iron deficiency anemia   . Ovarian cyst     Patient Active Problem List   Diagnosis Date Noted  . Asthma 08/19/2017  . Family history of congenital anomalies 08/19/2017  . Labor and delivery, indication for care 08/03/2017  . Anemia 10/28/2014    Past Surgical History:  Procedure Laterality Date  . CHOLECYSTECTOMY    . Nexplanon removal    . TUBAL LIGATION N/A 08/19/2017   Procedure: POST PARTUM TUBAL LIGATION;  Surgeon: Ward, Honor Loh, MD;  Location: [facility] ORS;  Service: Gynecology;  Laterality: N/A;    Prior to Admission medications   Medication Sig Start Date End Date Taking? Authorizing Provider  albuterol (PROVENTIL HFA;VENTOLIN HFA) 108 (90 Base) MCG/ACT inhaler Inhale 2 puffs into the lungs 2 (two) times daily.    [provider]  amoxicillin (AMOXIL) 500 MG capsule Take 1 capsule (500 mg total) by mouth 3 (three) times daily. 10/18/19   [name], PA-C  cetirizine (ZYRTEC) 10 MG tablet Take 10 mg by mouth daily.    [provider]    Allergies Diphenhydramine hcl and Unisom [doxylamine succinate (sleep)]  Family History  Problem Relation Age of Onset  . Asthma Mother   . Diabetes Maternal Grandfather   . Hypertension Maternal Grandfather    . Diabetes Maternal Uncle     Social History Social History   Tobacco Use  . Smoking status: Never Smoker  . Smokeless tobacco: Never Used  Substance Use Topics  . Alcohol use: No    Comment: not with pregnancy  . Drug use: No    Review of Systems Constitutional: Positive fever/chills Eyes: No visual changes. ENT: Positive sore throat. Cardiovascular: Denies chest pain. Respiratory: Denies shortness of breath. Gastrointestinal: No abdominal pain.  No nausea, no vomiting.  Musculoskeletal: Negative for back pain. Skin: Negative for rash. Neurological: Negative for headaches, focal weakness or numbness. ___________________________________________   PHYSICAL EXAM:  VITAL SIGNS: ED Triage Vitals  Enc Vitals Group     BP 10/18/19 1039 120/82     Pulse Rate 10/18/19 1039 90     Resp 10/18/19 1039 18     Temp 10/18/19 1039 (!) 100.8 F (38.2 C)     Temp Source 10/18/19 1039 Oral     SpO2 10/18/19 1039 98 %     Weight 10/18/19 1038 170 lb (77.1 kg)     Height 10/18/19 1038 5\' 2"  (1.575 m)     Head Circumference --      Peak Flow --      Pain Score 10/18/19 1044 10     Pain Loc --      Pain Edu? --      Excl. in [place]? --  Constitutional: Alert and oriented. Well appearing and in no acute distress. Eyes: Conjunctivae are normal. PERRL. EOMI. Head: Atraumatic. Nose: No congestion/rhinnorhea. Mouth/Throat: Mucous membranes are moist.  Oropharynx mild erythema.  Exudative tonsils are present bilaterally.  Tonsils at this time are nonkissing and uvula is midline.  No lymphadenopathy is noted at this time. Neck: No stridor.   Cardiovascular: Normal rate, regular rhythm. Grossly normal heart sounds.  Good peripheral circulation. Respiratory: Normal respiratory effort.  No retractions. Lungs CTAB. Musculoskeletal: No lower extremity tenderness nor edema.  No joint effusions. Neurologic:  Normal speech and language. No gross focal neurologic deficits are appreciated. No  gait instability. Skin:  Skin is warm, dry and intact. No rash noted. Psychiatric: Mood and affect are normal. Speech and behavior are normal.  ____________________________________________   LABS (all labs ordered are listed, but only abnormal results are displayed)  Labs Reviewed  GROUP A STREP BY PCR    PROCEDURES  Procedure(s) performed (including Critical Care):  Procedures  ____________________________________________   INITIAL IMPRESSION / ASSESSMENT AND PLAN / ED COURSE  As part of my medical decision making, I reviewed the following data within the electronic MEDICAL RECORD NUMBER Notes from prior ED visits and [place] Controlled Substance Database  Marykathryn Jaela Yepez was evaluated in Emergency Department on 10/18/2019 for the symptoms described in the history of present illness. She was evaluated in the context of the global COVID-19 pandemic, which necessitated consideration that the patient might be at risk for infection with the SARS-CoV-2 virus that causes COVID-19. Institutional protocols and algorithms that pertain to the evaluation of patients at risk for COVID-19 are in a state of rapid change based on information released by regulatory bodies including the CDC and federal and state organizations. These policies and algorithms were followed during the patient's care in the ED.  28 year old female presents to the ED with complaint of sore throat and fever for the last 2 days.  Strep test was negative.  Physical exam shows moderate exudative tonsils bilaterally with uvula midline.  Patient was started on amoxicillin 500 mg twice daily with food for the next 10 days.  She is encouraged to increase fluids and also take Tylenol as needed for pain.  She is to return to the emergency department if any severe worsening of her symptoms.  She was also given information to follow-up with [facility] should she decide that she wants to have her tonsils removed as her husband  thought that she would have them removed in the emergency department today. ____________________________________________   FINAL CLINICAL IMPRESSION(S) / ED DIAGNOSES  Final diagnoses:  Exudative tonsillitis     ED Discharge Orders         Ordered    amoxicillin (AMOXIL) 500 MG capsule  3 times daily     10/18/19 1225           Note:  This document was prepared using Dragon voice recognition software and may include unintentional dictation errors.    Tommi Rumps, PA-C 10/18/19 1523    Chesley Noon, MD 10/19/19 [phone]

## 2019-11-28 ENCOUNTER — Emergency Department
Admission: EM | Admit: 2019-11-28 | Discharge: 2019-11-28 | Disposition: A | Discharge status: Home / Self Care | Payer: Medicaid Other | Attending: Emergency Medicine | Admitting: Emergency Medicine

## 2019-11-28 ENCOUNTER — Encounter: Payer: Self-pay | Admitting: Emergency Medicine

## 2019-11-28 ENCOUNTER — Other Ambulatory Visit: Payer: Self-pay

## 2019-11-28 DIAGNOSIS — J45909 Unspecified asthma, uncomplicated: Secondary | ICD-10-CM | POA: Insufficient documentation

## 2019-11-28 DIAGNOSIS — Z79899 Other long term (current) drug therapy: Secondary | ICD-10-CM | POA: Insufficient documentation

## 2019-11-28 DIAGNOSIS — R55 Syncope and collapse: Secondary | ICD-10-CM

## 2019-11-28 LAB — BASIC METABOLIC PANEL
Anion gap: 10 (ref 5–15)
BUN: 12 mg/dL (ref 6–20)
CO2: 22 mmol/L (ref 22–32)
Calcium: 8.8 mg/dL — ABNORMAL LOW (ref 8.9–10.3)
Chloride: 109 mmol/L (ref 98–111)
Creatinine, Ser: 0.66 mg/dL (ref 0.44–1.00)
GFR calc Af Amer: >60 mL/min (ref >60)
GFR calc non Af Amer: >60 mL/min (ref >60)
Glucose, Bld: 105 mg/dL — ABNORMAL HIGH (ref 70–99)
Potassium: 3.7 mmol/L (ref 3.5–5.1)
Sodium: 141 mmol/L (ref 135–145)

## 2019-11-28 LAB — CBC
HCT: 36.6 % (ref 36.0–46.0)
Hemoglobin: 11.8 g/dL — ABNORMAL LOW (ref 12.0–15.0)
MCH: 28.3 pg (ref 26.0–34.0)
MCHC: 32.2 g/dL (ref 30.0–36.0)
MCV: 87.8 fL (ref 80.0–100.0)
Platelets: 367 10*3/uL (ref 150–400)
RBC: 4.17 MIL/uL (ref 3.87–5.11)
RDW: 14.4 % (ref 11.5–15.5)
WBC: 9.7 10*3/uL (ref 4.0–10.5)
nRBC: 0 % (ref 0.0–0.2)

## 2019-11-28 MED ORDER — SODIUM CHLORIDE 0.9% FLUSH: 3.0000 mL | Freq: Once | INTRAVENOUS | Status: DC

## 2019-11-28 NOTE — ED Triage Notes (Signed)
Pt in via EMS from COVID testing site. Pt had a syncopal episode while being swabbed.

## 2019-11-28 NOTE — ED Notes (Signed)
Pt denies any SHOB/CP at this time

## 2019-11-28 NOTE — ED Notes (Signed)
MD at bedside. 

## 2019-11-28 NOTE — ED Triage Notes (Signed)
C/O near syncope this afternoon at work. States she was feeling dizzy.  States she does have history of intermittent dizzy episodes.  Patent is AAOx3.  Skin warm and dry. NAD

## 2019-11-28 NOTE — ED Provider Notes (Signed)
[facility] Emergency Department Provider Note  ____________________________________________  Time seen: Approximately 8:01 PM  I have reviewed the triage vital signs and the nursing notes.   HISTORY  Chief Complaint Near Syncope    HPI Courtney Sanders is a 29 y.o. female with a history of asthma iron deficiency anemia who reports being in her usual state of health today and then at work she had an episode where she became hot and flushed, clammy, vision darkened and then got dizzy and passed out.  She says that there were people there who caught her and she did not hit the ground or get hurt.  She complains of some mild ache in the right side of her neck, no headache vision changes paresthesias or weakness.  No prodromal symptoms such as chest pain palpitations or shortness of breath or significant chest pain or palpitations afterward.      Past Medical History:  Diagnosis Date  . Allergic rhinitis   . Asthma   . Iron deficiency anemia   . Ovarian cyst      Patient Active Problem List   Diagnosis Date Noted  . Asthma 08/19/2017  . Family history of congenital anomalies 08/19/2017  . Labor and delivery, indication for care 08/03/2017  . Anemia 10/28/2014     Past Surgical History:  Procedure Laterality Date  . CHOLECYSTECTOMY    . Nexplanon removal    . TUBAL LIGATION N/A 08/19/2017   Procedure: POST PARTUM TUBAL LIGATION;  Surgeon: Ward, Honor Loh, MD;  Location: [facility] ORS;  Service: Gynecology;  Laterality: N/A;     Prior to Admission medications   Medication Sig Start Date End Date Taking? Authorizing Provider  albuterol (PROVENTIL HFA;VENTOLIN HFA) 108 (90 Base) MCG/ACT inhaler Inhale 2 puffs into the lungs 2 (two) times daily.    [provider]  amoxicillin (AMOXIL) 500 MG capsule Take 1 capsule (500 mg total) by mouth 3 (three) times daily. 10/18/19   [name], PA-C  cetirizine (ZYRTEC) 10 MG tablet Take  10 mg by mouth daily.    [provider]     Allergies Diphenhydramine hcl and Unisom [doxylamine succinate (sleep)]   Family History  Problem Relation Age of Onset  . Asthma Mother   . Diabetes Maternal Grandfather   . Hypertension Maternal Grandfather   . Diabetes Maternal Uncle     Social History Social History   Tobacco Use  . Smoking status: Never Smoker  . Smokeless tobacco: Never Used  Substance Use Topics  . Alcohol use: No    Comment: not with pregnancy  . Drug use: No    Review of Systems  Constitutional:   No fever or chills.  ENT:   No sore throat. No rhinorrhea. Cardiovascular:   No chest pain or syncope. Respiratory:   No dyspnea or cough. Gastrointestinal:   Negative for abdominal pain, vomiting and diarrhea.  Musculoskeletal:   Negative for focal pain or swelling All other systems reviewed and are negative except as documented above in ROS and HPI.  ____________________________________________   PHYSICAL EXAM:  VITAL SIGNS: ED Triage Vitals  Enc Vitals Group     BP 11/28/19 1620 118/74     Pulse Rate 11/28/19 1620 64     Resp 11/28/19 1620 16     Temp 11/28/19 1620 98.5 F (36.9 C)     Temp Source 11/28/19 1620 Oral     SpO2 11/28/19 1620 100 %     Weight 11/28/19  1620 167 lb (75.8 kg)     Height 11/28/19 1620 5\' 2"  (1.575 m)     Head Circumference --      Peak Flow --      Pain Score 11/28/19 1619 0     Pain Loc --      Pain Edu? --      Excl. in [place]? --     Vital signs reviewed, nursing assessments reviewed.   Constitutional:   Alert and oriented. Non-toxic appearance. Eyes:   Conjunctivae are normal. EOMI.  ENT      Head:   Normocephalic and atraumatic.      Nose:   Wearing a mask.      Mouth/Throat:   Wearing a mask.      Neck:   No meningismus. Full ROM.  Superficial right posterior neck musculature tender to the touch without inflammatory changes. Hematological/Lymphatic/Immunilogical:   No cervical  lymphadenopathy. Cardiovascular:   RRR. Symmetric bilateral radial and DP pulses.  No murmurs. Cap refill less than 2 seconds. Respiratory:   Normal respiratory effort without tachypnea/retractions. Breath sounds are clear and equal bilaterally. No wheezes/rales/rhonchi.  Musculoskeletal:   Normal range of motion in all extremities. No joint effusions.  No lower extremity tenderness.  No edema. Neurologic:   Normal speech and language.  Motor grossly intact. No acute focal neurologic deficits are appreciated.  Skin:    Skin is warm, dry and intact. No rash noted.  No petechiae, purpura, or bullae.  ____________________________________________    LABS (pertinent positives/negatives) (all labs ordered are listed, but only abnormal results are displayed) Labs Reviewed  BASIC METABOLIC PANEL - Abnormal; Notable for the following components:      Result Value   Glucose, Bld 105 (*)    Calcium 8.8 (*)    All other components within normal limits  CBC - Abnormal; Notable for the following components:   Hemoglobin 11.8 (*)    All other components within normal limits  URINALYSIS, COMPLETE (UACMP) WITH MICROSCOPIC  CBG MONITORING, ED  POC URINE PREG, ED   ____________________________________________   EKG  Interpreted by me  Date: 11/28/2019  Rate: 63  Rhythm: normal sinus rhythm  QRS Axis: normal  Intervals: normal  ST/T Wave abnormalities: normal  Conduction Disutrbances: none  Narrative Interpretation: unremarkable      ____________________________________________    RADIOLOGY  No results found.  ____________________________________________   PROCEDURES Procedures  ____________________________________________    CLINICAL IMPRESSION / ASSESSMENT AND PLAN / ED COURSE  Medications ordered in the ED: Medications  sodium chloride flush (NS) 0.9 % injection 3 mL (has no administration in time range)    Pertinent labs & imaging results that were available  during my care of the patient were reviewed by me and considered in my medical decision making (see chart for details).  Courtney Sanders was evaluated in Emergency Department on 11/28/2019 for the symptoms described in the history of present illness. She was evaluated in the context of the global COVID-19 pandemic, which necessitated consideration that the patient might be at risk for infection with the SARS-CoV-2 virus that causes COVID-19. Institutional protocols and algorithms that pertain to the evaluation of patients at risk for COVID-19 are in a state of rapid change based on information released by regulatory bodies including the CDC and federal and state organizations. These policies and algorithms were followed during the patient's care in the ED.   Patient presents with vasovagal syncope.  No worrisome prodromal symptoms, currently asymptomatic  except for some mild right neck pain which appears musculoskeletal on exam.  EKG unremarkable, vital signs normal, basic lab panel unremarkable.  No significant anemia or electrolyte abnormality      ____________________________________________   FINAL CLINICAL IMPRESSION(S) / ED DIAGNOSES    Final diagnoses:  Vasovagal syncope     ED Discharge Orders    None      Portions of this note were generated with dragon dictation software. Dictation errors may occur despite best attempts at proofreading.   Sharman Cheek, MD 11/28/19 2004

## 2019-12-01 ENCOUNTER — Emergency Department: Payer: Self-pay

## 2019-12-01 ENCOUNTER — Emergency Department
Admission: EM | Admit: 2019-12-01 | Discharge: 2019-12-01 | Disposition: A | Discharge status: Home / Self Care | Payer: Self-pay | Attending: Emergency Medicine | Admitting: Emergency Medicine

## 2019-12-01 ENCOUNTER — Encounter: Payer: Self-pay | Admitting: Emergency Medicine

## 2019-12-01 ENCOUNTER — Other Ambulatory Visit: Payer: Self-pay

## 2019-12-01 DIAGNOSIS — R519 Headache, unspecified: Secondary | ICD-10-CM | POA: Insufficient documentation

## 2019-12-01 DIAGNOSIS — Z79899 Other long term (current) drug therapy: Secondary | ICD-10-CM | POA: Insufficient documentation

## 2019-12-01 DIAGNOSIS — J45909 Unspecified asthma, uncomplicated: Secondary | ICD-10-CM | POA: Insufficient documentation

## 2019-12-01 DIAGNOSIS — R55 Syncope and collapse: Secondary | ICD-10-CM | POA: Insufficient documentation

## 2019-12-01 LAB — URINALYSIS, COMPLETE (UACMP) WITH MICROSCOPIC
Bacteria, UA: NONE SEEN
Bilirubin Urine: NEGATIVE
Glucose, UA: NEGATIVE mg/dL
Ketones, ur: NEGATIVE mg/dL
Leukocytes,Ua: NEGATIVE
Nitrite: NEGATIVE
Protein, ur: NEGATIVE mg/dL
Specific Gravity, Urine: 1.01 (ref 1.005–1.030)
pH: 7 (ref 5.0–8.0)

## 2019-12-01 LAB — BASIC METABOLIC PANEL
Anion gap: 11 (ref 5–15)
BUN: 13 mg/dL (ref 6–20)
CO2: 23 mmol/L (ref 22–32)
Calcium: 9.7 mg/dL (ref 8.9–10.3)
Chloride: 104 mmol/L (ref 98–111)
Creatinine, Ser: 0.49 mg/dL (ref 0.44–1.00)
GFR calc Af Amer: >60 mL/min (ref >60)
GFR calc non Af Amer: >60 mL/min (ref >60)
Glucose, Bld: 113 mg/dL — ABNORMAL HIGH (ref 70–99)
Potassium: 3.7 mmol/L (ref 3.5–5.1)
Sodium: 138 mmol/L (ref 135–145)

## 2019-12-01 LAB — CBC
HCT: 39.7 % (ref 36.0–46.0)
Hemoglobin: 12.8 g/dL (ref 12.0–15.0)
MCH: 28 pg (ref 26.0–34.0)
MCHC: 32.2 g/dL (ref 30.0–36.0)
MCV: 86.9 fL (ref 80.0–100.0)
Platelets: 349 10*3/uL (ref 150–400)
RBC: 4.57 MIL/uL (ref 3.87–5.11)
RDW: 14.3 % (ref 11.5–15.5)
WBC: 9.1 10*3/uL (ref 4.0–10.5)
nRBC: 0 % (ref 0.0–0.2)

## 2019-12-01 LAB — URINE DRUG SCREEN, QUALITATIVE (ARMC ONLY)
Amphetamines, Ur Screen: NOT DETECTED
Barbiturates, Ur Screen: NOT DETECTED
Benzodiazepine, Ur Scrn: NOT DETECTED
Cannabinoid 50 Ng, Ur ~~LOC~~: NOT DETECTED
Cocaine Metabolite,Ur ~~LOC~~: NOT DETECTED
MDMA (Ecstasy)Ur Screen: NOT DETECTED
Methadone Scn, Ur: NOT DETECTED
Opiate, Ur Screen: NOT DETECTED
Phencyclidine (PCP) Ur S: NOT DETECTED
Tricyclic, Ur Screen: NOT DETECTED

## 2019-12-01 LAB — GLUCOSE, CAPILLARY: Glucose-Capillary: 73 mg/dL (ref 70–99)

## 2019-12-01 LAB — POCT PREGNANCY, URINE: Preg Test, Ur: NEGATIVE

## 2019-12-01 MED ORDER — METOCLOPRAMIDE HCL 5 MG PO TABS: 5.0000 mg | ORAL_TABLET | Freq: Three times a day (TID) | ORAL | 0 refills | Status: DC | PRN

## 2019-12-01 MED ORDER — NALOXONE HCL 4 MG/0.1ML NA LIQD
1.0000 | Freq: Once | NASAL | Status: AC
  Administered 2019-12-01: 1 via NASAL
  Filled 2019-12-01: qty 4

## 2019-12-01 MED ORDER — NALOXONE HCL 2 MG/2ML IJ SOSY
PREFILLED_SYRINGE | INTRAMUSCULAR | Status: AC
  Filled 2019-12-01: qty 2

## 2019-12-01 MED ORDER — KETOROLAC TROMETHAMINE 30 MG/ML IJ SOLN
30.0000 mg | Freq: Once | INTRAMUSCULAR | Status: AC
  Administered 2019-12-01: 30 mg via INTRAVENOUS
  Filled 2019-12-01: qty 1

## 2019-12-01 MED ORDER — METOCLOPRAMIDE HCL 5 MG/ML IJ SOLN
10.0000 mg | Freq: Once | INTRAMUSCULAR | Status: AC
  Administered 2019-12-01: 10 mg via INTRAVENOUS
  Filled 2019-12-01: qty 2

## 2019-12-01 NOTE — ED Provider Notes (Addendum)
[facility] Emergency Department Provider Note ____________________________________________  Time seen: 1516  I have reviewed the triage vital signs and the nursing notes.  HISTORY  Chief Complaint  Loss of Consciousness  History provider by the patient.  HPI Courtney Sanders is a 29 y.o. female presents herself to the ED via EMS from her place of employment.  Patient apparently had a witnessed syncopal episode prior to arrival. she describes this is the second episode in 3 days. She denies any prodrome or aura. She notes she got up from her workstation and walked into her supervisors office, where she apparently passed out. She denies any injury related to the syncopal episode. She also reports a right-sided headache, currently. She denies vision changes, nausea, or vomiting. She does admit to fleeting episodes of vertigo, describing the room spinning. She has iron-deficieny anemia, and   Past Medical History:  Diagnosis Date  . Allergic rhinitis   . Asthma   . Iron deficiency anemia   . Ovarian cyst     Patient Active Problem List   Diagnosis Date Noted  . Asthma 08/19/2017  . Family history of congenital anomalies 08/19/2017  . Labor and delivery, indication for care 08/03/2017  . Anemia 10/28/2014    Past Surgical History:  Procedure Laterality Date  . CHOLECYSTECTOMY    . Nexplanon removal    . TUBAL LIGATION N/A 08/19/2017   Procedure: POST PARTUM TUBAL LIGATION;  Surgeon: Ward, Elenora Fender, MD;  Location: [facility] ORS;  Service: Gynecology;  Laterality: N/A;    Prior to Admission medications   Medication Sig Start Date End Date Taking? Authorizing Provider  albuterol (PROVENTIL HFA;VENTOLIN HFA) 108 (90 Base) MCG/ACT inhaler Inhale 2 puffs into the lungs 2 (two) times daily.    [provider]  cetirizine (ZYRTEC) 10 MG tablet Take 10 mg by mouth daily.    [provider]    Allergies Diphenhydramine hcl and Unisom  [doxylamine succinate (sleep)]  Family History  Problem Relation Age of Onset  . Asthma Mother   . Diabetes Maternal Grandfather   . Hypertension Maternal Grandfather   . Diabetes Maternal Uncle     Social History Social History   Tobacco Use  . Smoking status: Never Smoker  . Smokeless tobacco: Never Used  Substance Use Topics  . Alcohol use: No    Comment: not with pregnancy  . Drug use: No    Review of Systems  Constitutional: Negative for fever. Eyes: Negative for visual changes. ENT: Negative for sore throat. Cardiovascular: Negative for chest pain. Respiratory: Negative for shortness of breath. Gastrointestinal: Negative for abdominal pain, vomiting and diarrhea. Genitourinary: Negative for dysuria. Musculoskeletal: Negative for back pain. Skin: Negative for rash. Neurological: Positive for headaches any syncope as above. No focal weakness or numbness. ____________________________________________  PHYSICAL EXAM:  VITAL SIGNS: ED Triage Vitals [12/01/19 1214]  Enc Vitals Group     BP 103/61     Pulse Rate 69     Resp 18     Temp 98.4 F (36.9 C)     Temp Source Oral     SpO2 98 %     Weight 167 lb (75.8 kg)     Height 5\' 2"  (1.575 m)     Head Circumference      Peak Flow      Pain Score 0     Pain Loc      Pain Edu?      Excl. in [place]?  Constitutional: Alert and oriented. Well appearing and in no distress. GCS=15 Head: Normocephalic and atraumatic. Eyes: Conjunctivae are normal. PERRL. Normal extraocular movements and fundi bilaterally.  Ears: Canals clear. TMs intact bilaterally. Nose: No congestion/rhinorrhea/epistaxis. Mouth/Throat: Mucous membranes are moist. Neck: Supple. Normal ROM.  Cardiovascular: Normal rate, regular rhythm. Normal distal pulses. Respiratory: Normal respiratory effort. No wheezes/rales/rhonchi. Gastrointestinal: Soft and nontender. No distention. Musculoskeletal: Nontender with normal range of motion in all  extremities.  Neurologic: CN II-XII grossly intact. Patient noted to have intermittent resting, rhythmic tapping/tremor of fingers of right hand. Tremor dissipates with activity or distraction. No cerebellar ataxia appreciated. Normal finger-to-nose. Negative pronator drift. Normal tandem walk. Normal rapid-alternating movements demonstrated by patting hands on lap. Normal UR/LE DTRs bilaterally.  Normal gait without ataxia. Normal speech and language. No gross focal neurologic deficits are appreciated. Skin:  Skin is warm, dry and intact. No rash noted. Psychiatric: Mood and affect are normal. Patient exhibits appropriate insight and judgment. ____________________________________________   LABS (pertinent positives/negatives) Labs Reviewed  BASIC METABOLIC PANEL - Abnormal; Notable for the following components:      Result Value   Glucose, Bld 113 (*)    All other components within normal limits  URINALYSIS, COMPLETE (UACMP) WITH MICROSCOPIC - Abnormal; Notable for the following components:   Color, Urine YELLOW (*)    APPearance CLEAR (*)    Hgb urine dipstick LARGE (*)    All other components within normal limits  CBC  URINE DRUG SCREEN, QUALITATIVE ([facility] ONLY)  GLUCOSE, CAPILLARY  POC URINE PREG, ED  POCT PREGNANCY, URINE  ____________________________________________  EKG  NSR 62 bpm PR interval 158 ms QRS duration 84 ms No ST changes No STEMI ____________________________________________   RADIOLOGY  Head CT w/o CM  IMPRESSION: No acute intracranial abnormality.  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 ____________________________________________  PROCEDURES  Narcan  4 mg intranasal x 1 Toradol 30 mg IVP Reglan 10 gm IVP Procedures ____________________________________________  INITIAL  IMPRESSION / ASSESSMENT AND PLAN / ED COURSE  ----------------------------------------- 5:09 PM on 12/01/2019 ----------------------------------------- CT Tech Georgiann Hahn) to retreive patient for imaging. She notified J. Cuthriell, PA-C and this provider that patient appears obtunded and unresponsive. No seizure activity witnessed. Episode lasted about 3 minutes, before patient began to respond verbally. She endorses generalized weakness and fatigue. No incontinence noted.   ----------------------------------------- 7:31 PM on 12/01/2019 ----------------------------------------- Patient back from CT scan. She is awake, upright, and texting on her cell phone. She is notified of normal results. She is still endorsing a frontal/pariental headache. She will be treated with Toradol and Reglan prior to discharge. She is with a normal exam and labs following report of a syncopal episode at work. She had a witnessed syncopal episode in the ED. She has return to baseline at this time. She will be discharged to follow-up with Neuro for ongoing symptoms. She does admit to some home/life stressors at this time, including an upcoming court summons related to custody of her child. She is reassured at this time that no organic etiology is isolated. Return precautions have been reviewed.   Patient final disposition post-med administration transferred to J. Cuthriell, PA-C  Falecia Sacheen Arrasmith was evaluated in Emergency Department on 12/01/2019 for the symptoms described in the history of present illness. She was evaluated in the context of the global COVID-19 pandemic, which  necessitated consideration that the patient might be at risk for infection with the SARS-CoV-2 virus that causes COVID-19. Institutional protocols and algorithms that pertain to the evaluation of patients at risk for COVID-19 are in a state of rapid change based on information released by regulatory bodies including the CDC and federal and  state organizations. These policies and algorithms were followed during the patient's care in the ED. ____________________________________________  FINAL CLINICAL IMPRESSION(S) / ED DIAGNOSES  Final diagnoses:  Syncope, unspecified syncope type      Kyarra Vancamp, Dannielle Karvonen, PA-C 12/01/19 1935    Melvenia Needles, [place] 12/01/19 Lattie Corns    Arta Silence, MD 12/01/19 2241

## 2019-12-01 NOTE — ED Notes (Signed)
Pt requesting to use the restroom, pt able to transfer with one person assist to wheelchair. Pt reports legs feel heavy and reports they feel like they are hard to move sometimes.

## 2019-12-01 NOTE — Discharge Instructions (Signed)
Your exam, labs, EKG, and CT were normal. You appear to have recovered from a syncopal episode in the ED. You should follow-up with Neurology for further evaluation and management. Take Tylenol and Motrin for any headaches. Return to the ED as needed.

## 2019-12-01 NOTE — ED Notes (Signed)
First Nurse Note: Pt to ED via [facility] from home for weakness since Wednesday.  CBG 136. Pt has hx/o Asthma. Pt is in NAD

## 2019-12-01 NOTE — ED Triage Notes (Signed)
Pt arrived via [facility] from work with reports of syncopal episode. Pt seen 1/13 for the same sxs, pt has hx of dizzy episodes.   Pt dx on 1/13 with vasovagal syncope.  Pt is alert and oriented on arrival, no distress noted.

## 2019-12-01 NOTE — ED Provider Notes (Signed)
-----------------------------------------   5:12 PM on 12/01/2019 -----------------------------------------  ED ECG REPORT I, Dionne Bucy, the attending physician, personally viewed and interpreted this ECG.  Date: 12/01/2019 EKG Time: 1655 Rate: 62 Rhythm: normal sinus rhythm QRS Axis: normal Intervals: normal ST/T Wave abnormalities: normal Narrative Interpretation: no evidence of acute ischemia    Dionne Bucy, MD 12/01/19 1712

## 2019-12-01 NOTE — ED Notes (Signed)
Pt states she has been "fainting spells since Wednesday" pt states "I can't talk or move during the spells but I can see and hear"

## 2020-02-14 ENCOUNTER — Ambulatory Visit: Payer: Medicaid Other

## 2020-08-01 ENCOUNTER — Emergency Department: Payer: Worker's Compensation

## 2020-08-01 ENCOUNTER — Emergency Department
Admission: EM | Admit: 2020-08-01 | Discharge: 2020-08-01 | Disposition: A | Discharge status: Home / Self Care | Payer: Worker's Compensation | Attending: Emergency Medicine | Admitting: Emergency Medicine

## 2020-08-01 ENCOUNTER — Other Ambulatory Visit: Payer: Self-pay

## 2020-08-01 DIAGNOSIS — J45909 Unspecified asthma, uncomplicated: Secondary | ICD-10-CM | POA: Insufficient documentation

## 2020-08-01 DIAGNOSIS — R52 Pain, unspecified: Secondary | ICD-10-CM

## 2020-08-01 DIAGNOSIS — W010XXA Fall on same level from slipping, tripping and stumbling without subsequent striking against object, initial encounter: Secondary | ICD-10-CM | POA: Diagnosis not present

## 2020-08-01 DIAGNOSIS — S93401A Sprain of unspecified ligament of right ankle, initial encounter: Secondary | ICD-10-CM | POA: Diagnosis not present

## 2020-08-01 DIAGNOSIS — S99911A Unspecified injury of right ankle, initial encounter: Secondary | ICD-10-CM | POA: Diagnosis present

## 2020-08-01 MED ORDER — IBUPROFEN 600 MG PO TABS
600.0000 mg | ORAL_TABLET | Freq: Once | ORAL | Status: AC
  Administered 2020-08-01: 600 mg via ORAL
  Filled 2020-08-01: qty 1

## 2020-08-01 MED ORDER — TRAMADOL HCL 50 MG PO TABS: 50.0000 mg | ORAL_TABLET | Freq: Four times a day (QID) | ORAL | 0 refills | Status: DC | PRN

## 2020-08-01 MED ORDER — TRAMADOL HCL 50 MG PO TABS
50.0000 mg | ORAL_TABLET | Freq: Once | ORAL | Status: AC
  Administered 2020-08-01: 50 mg via ORAL
  Filled 2020-08-01: qty 1

## 2020-08-01 MED ORDER — IBUPROFEN 600 MG PO TABS: 600.0000 mg | ORAL_TABLET | Freq: Three times a day (TID) | ORAL | 0 refills | Status: DC | PRN

## 2020-08-01 NOTE — ED Triage Notes (Signed)
Pt was at work and tripped over a cone. Pt has ankle wrapped in brown kerlix. NAD in triage.

## 2020-08-01 NOTE — ED Provider Notes (Signed)
[facility] Emergency Department Provider Note   ____________________________________________   First MD Initiated Contact with Patient 08/01/20 1151     (approximate)  I have reviewed the triage vital signs and the nursing notes.   HISTORY  Chief Complaint Ankle Pain    HPI Courtney Sanders is a 29 y.o. female patient presents with right ankle pain secondary to a twisting incident 2 days ago.  Patient states she tripped over a cone.  Patient the pain increased with prolonged standing and ambulation.  Patient denies loss of sensation.  Rates pain as a 4/10.  Described pain is "achy".  Patient had elastic wrap applied to ankle prior to arrival.         Past Medical History:  Diagnosis Date  . Allergic rhinitis   . Asthma   . Iron deficiency anemia   . Ovarian cyst     Patient Active Problem List   Diagnosis Date Noted  . Asthma 08/19/2017  . Family history of congenital anomalies 08/19/2017  . Labor and delivery, indication for care 08/03/2017  . Anemia 10/28/2014    Past Surgical History:  Procedure Laterality Date  . CHOLECYSTECTOMY    . Nexplanon removal    . TUBAL LIGATION N/A 08/19/2017   Procedure: POST PARTUM TUBAL LIGATION;  Surgeon: Ward, Elenora Fender, MD;  Location: [facility] ORS;  Service: Gynecology;  Laterality: N/A;    Prior to Admission medications   Medication Sig Start Date End Date Taking? Authorizing Provider  albuterol (PROVENTIL HFA;VENTOLIN HFA) 108 (90 Base) MCG/ACT inhaler Inhale 2 puffs into the lungs 2 (two) times daily.    [provider]  cetirizine (ZYRTEC) 10 MG tablet Take 10 mg by mouth daily.    [provider]  ibuprofen (ADVIL) 600 MG tablet Take 1 tablet (600 mg total) by mouth every 8 (eight) hours as needed. 08/01/20   Joni Reining, PA-C  metoCLOPramide (REGLAN) 5 MG tablet Take 1 tablet (5 mg total) by mouth every 8 (eight) hours as needed for up to 5 days for nausea or  vomiting. 12/01/19 12/06/19  Menshew, Charlesetta Ivory, PA-C  traMADol (ULTRAM) 50 MG tablet Take 1 tablet (50 mg total) by mouth every 6 (six) hours as needed for moderate pain. 08/01/20   Joni Reining, PA-C    Allergies Diphenhydramine hcl and Unisom [doxylamine succinate (sleep)]  Family History  Problem Relation Age of Onset  . Asthma Mother   . Diabetes Maternal Grandfather   . Hypertension Maternal Grandfather   . Diabetes Maternal Uncle     Social History Social History   Tobacco Use  . Smoking status: Never Smoker  . Smokeless tobacco: Never Used  Vaping Use  . Vaping Use: Unknown  Substance Use Topics  . Alcohol use: No    Comment: not with pregnancy  . Drug use: No    Review of Systems Constitutional: No fever/chills Eyes: No visual changes. ENT: No sore throat. Cardiovascular: Denies chest pain. Respiratory: Denies shortness of breath. Gastrointestinal: No abdominal pain.  No nausea, no vomiting.  No diarrhea.  No constipation. Genitourinary: Negative for dysuria. Musculoskeletal: Right ankle pain. Skin: Negative for rash. Neurological: Negative for headaches, focal weakness or numbness. Allergic/Immunilogical: Benadryl. ____________________________________________   PHYSICAL EXAM:  VITAL SIGNS: ED Triage Vitals [08/01/20 1127]  Enc Vitals Group     BP (!) 109/57     Pulse Rate 64     Resp 16     Temp 98.2 F (  36.8 C)     Temp Source Oral     SpO2 98 %     Weight 140 lb (63.5 kg)     Height 5\' 2"  (1.575 m)     Head Circumference      Peak Flow      Pain Score 4     Pain Loc      Pain Edu?      Excl. in GC?    Constitutional: Alert and oriented. Well appearing and in no acute distress. Cardiovascular: Normal rate, regular rhythm. Grossly normal heart sounds.  Good peripheral circulation. Respiratory: Normal respiratory effort.  No retractions. Lungs CTAB. Musculoskeletal: No obvious deformity to the right ankle.  Patient is moderate guarding  palpation of the lateral aspect of the ankle.  Mild edema.  Decreased range of motion with eversion. Neurologic:  Normal speech and language. No gross focal neurologic deficits are appreciated. No gait instability. Skin:  Skin is warm, dry and intact. No rash noted.  No abrasion or ecchymosis. Psychiatric: Mood and affect are normal. Speech and behavior are normal.  ____________________________________________   LABS (all labs ordered are listed, but only abnormal results are displayed)  Labs Reviewed - No data to display ____________________________________________  EKG   ____________________________________________  RADIOLOGY  ED MD interpretation:    Official radiology report(s): DG Ankle Complete Right  Result Date: 08/01/2020 CLINICAL DATA:  C/o pain to right ankle. states she twisted ankle at work on 07/30/20. Most pain to lateral malleoli. EXAM: RIGHT ANKLE - COMPLETE 3 VIEW COMPARISON:  None. FINDINGS: No fracture.  No bone lesion. Ankle joint normally spaced and aligned.  No arthropathic changes. Mild lateral soft tissue swelling. IMPRESSION: No fracture or dislocation. Electronically Signed   By: 08/01/20 M.D.   On: 08/01/2020 12:07    ____________________________________________   PROCEDURES  Procedure(s) performed (including Critical Care):  Procedures   ____________________________________________   INITIAL IMPRESSION / ASSESSMENT AND PLAN / ED COURSE  As part of my medical decision making, I reviewed the following data within the electronic MEDICAL RECORD NUMBER     Patient presents right ankle pain secondary to a trip and fall.  Discussed x-ray findings with patient.  Patient complaint physical exam consistent with sprain ankle.  Patient placed in a ankle  stirrup splint and given discharge care instructions.  Patient given a work note and advised to follow-up if no improvement in 3 to 5 days.           ____________________________________________   FINAL CLINICAL IMPRESSION(S) / ED DIAGNOSES  Final diagnoses:  Pain  Sprain of right ankle, unspecified ligament, initial encounter     ED Discharge Orders         Ordered    ibuprofen (ADVIL) 600 MG tablet  Every 8 hours PRN        08/01/20 1220    traMADol (ULTRAM) 50 MG tablet  Every 6 hours PRN        08/01/20 1220          *Please note:  Georgi Aveah Castell was evaluated in Emergency Department on 08/01/2020 for the symptoms described in the history of present illness. She was evaluated in the context of the global COVID-19 pandemic, which necessitated consideration that the patient might be at risk for infection with the SARS-CoV-2 virus that causes COVID-19. Institutional protocols and algorithms that pertain to the evaluation of patients at risk for COVID-19 are in a state of rapid change based on information  released by regulatory bodies including the CDC and federal and state organizations. These policies and algorithms were followed during the patient's care in the ED.  Some ED evaluations and interventions may be delayed as a result of limited staffing during and the pandemic.*   Note:  This document was prepared using Dragon voice recognition software and may include unintentional dictation errors.    Joni Reining, PA-C 08/01/20 1226    Arnaldo Natal, MD 08/01/20 1538

## 2020-08-01 NOTE — Discharge Instructions (Signed)
Wear ankle splint for 3 to 5 days.  Follow discharge care instruction take medication as directed.

## 2020-08-01 NOTE — ED Notes (Signed)
See triage note  Presents with pain to right ankle   States she twisted ankle at work on weds.  Min swelling

## 2020-08-12 ENCOUNTER — Encounter: Payer: Self-pay | Admitting: Emergency Medicine

## 2020-08-12 ENCOUNTER — Emergency Department
Admission: EM | Admit: 2020-08-12 | Discharge: 2020-08-12 | Disposition: A | Discharge status: Home / Self Care | Payer: Medicaid Other | Attending: Emergency Medicine | Admitting: Emergency Medicine

## 2020-08-12 DIAGNOSIS — J45909 Unspecified asthma, uncomplicated: Secondary | ICD-10-CM | POA: Insufficient documentation

## 2020-08-12 DIAGNOSIS — R55 Syncope and collapse: Secondary | ICD-10-CM | POA: Insufficient documentation

## 2020-08-12 DIAGNOSIS — Z79899 Other long term (current) drug therapy: Secondary | ICD-10-CM | POA: Insufficient documentation

## 2020-08-12 LAB — URINALYSIS, COMPLETE (UACMP) WITH MICROSCOPIC
Bacteria, UA: NONE SEEN
Bilirubin Urine: NEGATIVE
Glucose, UA: NEGATIVE mg/dL
Ketones, ur: NEGATIVE mg/dL
Nitrite: NEGATIVE
Protein, ur: NEGATIVE mg/dL
Specific Gravity, Urine: 1.016 (ref 1.005–1.030)
pH: 5 (ref 5.0–8.0)

## 2020-08-12 LAB — CBC
HCT: 38.1 % (ref 36.0–46.0)
Hemoglobin: 12.6 g/dL (ref 12.0–15.0)
MCH: 28.6 pg (ref 26.0–34.0)
MCHC: 33.1 g/dL (ref 30.0–36.0)
MCV: 86.6 fL (ref 80.0–100.0)
Platelets: 364 10*3/uL (ref 150–400)
RBC: 4.4 MIL/uL (ref 3.87–5.11)
RDW: 13.3 % (ref 11.5–15.5)
WBC: 11.8 10*3/uL — ABNORMAL HIGH (ref 4.0–10.5)
nRBC: 0 % (ref 0.0–0.2)

## 2020-08-12 LAB — BASIC METABOLIC PANEL
Anion gap: 9 (ref 5–15)
BUN: 13 mg/dL (ref 6–20)
CO2: 24 mmol/L (ref 22–32)
Calcium: 9 mg/dL (ref 8.9–10.3)
Chloride: 105 mmol/L (ref 98–111)
Creatinine, Ser: 0.49 mg/dL (ref 0.44–1.00)
GFR calc Af Amer: >60 mL/min (ref >60)
GFR calc non Af Amer: >60 mL/min (ref >60)
Glucose, Bld: 97 mg/dL (ref 70–99)
Potassium: 3.4 mmol/L — ABNORMAL LOW (ref 3.5–5.1)
Sodium: 138 mmol/L (ref 135–145)

## 2020-08-12 LAB — TROPONIN I (HIGH SENSITIVITY): Troponin I (High Sensitivity): <2 ng/L (ref <18)

## 2020-08-12 LAB — POCT PREGNANCY, URINE: Preg Test, Ur: NEGATIVE

## 2020-08-12 NOTE — ED Notes (Signed)
See triage note, pt reports received 2nd covid vaccine at health dpt and fainted 5 minutes later.  Denies CP, SHOB, reports was feeling weak earlier which has since improved.  Hx of syncope  NAD noted. To treatment room in w/c

## 2020-08-12 NOTE — ED Triage Notes (Signed)
Pt reports she had syncopal episode post 2nd COVID vaccination today at the [facility] department. Denies symptoms of dizziness or weakness in triage. Denies hitting head.

## 2020-08-12 NOTE — ED Triage Notes (Signed)
Pt comes into the ED via EMS from the health department after receiving her 2nd pfizer vaccine and is had a near syncope, is havig generalized weakness, CBG 95, 110/72, HR 60'S. 98%RA pt is a/ox4.Marland Kitchen

## 2020-08-12 NOTE — ED Provider Notes (Signed)
[facility] Emergency Department Provider Note  ____________________________________________  Time seen: Approximately 9:04 PM  I have reviewed the triage vital signs and the nursing notes.   HISTORY  Chief Complaint Loss of Consciousness    HPI Courtney Sanders is a 29 y.o. female who presents the emergency department for evaluation of a syncopal episode.  Patient states that she received her second Covid vaccination today at health department.  As she was leaving she felt lightheaded and passed out.  Patient denies any symptoms currently.  She does have a history of syncope.  No medications prior to arrival.  Patient denies any pain complaints from syncopal episode.  She denies hitting her head during the syncope         Past Medical History:  Diagnosis Date  . Allergic rhinitis   . Asthma   . Iron deficiency anemia   . Ovarian cyst     Patient Active Problem List   Diagnosis Date Noted  . Asthma 08/19/2017  . Family history of congenital anomalies 08/19/2017  . Labor and delivery, indication for care 08/03/2017  . Anemia 10/28/2014    Past Surgical History:  Procedure Laterality Date  . CHOLECYSTECTOMY    . Nexplanon removal    . TUBAL LIGATION N/A 08/19/2017   Procedure: POST PARTUM TUBAL LIGATION;  Surgeon: Ward, Elenora Fender, MD;  Location: [facility] ORS;  Service: Gynecology;  Laterality: N/A;    Prior to Admission medications   Medication Sig Start Date End Date Taking? Authorizing Provider  albuterol (PROVENTIL HFA;VENTOLIN HFA) 108 (90 Base) MCG/ACT inhaler Inhale 2 puffs into the lungs 2 (two) times daily.    [provider]  cetirizine (ZYRTEC) 10 MG tablet Take 10 mg by mouth daily.    [provider]  ibuprofen (ADVIL) 600 MG tablet Take 1 tablet (600 mg total) by mouth every 8 (eight) hours as needed. 08/01/20   Joni Reining, PA-C  metoCLOPramide (REGLAN) 5 MG tablet Take 1 tablet (5 mg total) by mouth  every 8 (eight) hours as needed for up to 5 days for nausea or vomiting. 12/01/19 12/06/19  Menshew, Charlesetta Ivory, PA-C  traMADol (ULTRAM) 50 MG tablet Take 1 tablet (50 mg total) by mouth every 6 (six) hours as needed for moderate pain. 08/01/20   Joni Reining, PA-C    Allergies Diphenhydramine hcl and Unisom [doxylamine succinate (sleep)]  Family History  Problem Relation Age of Onset  . Asthma Mother   . Diabetes Maternal Grandfather   . Hypertension Maternal Grandfather   . Diabetes Maternal Uncle     Social History Social History   Tobacco Use  . Smoking status: Never Smoker  . Smokeless tobacco: Never Used  Vaping Use  . Vaping Use: Unknown  Substance Use Topics  . Alcohol use: No    Comment: not with pregnancy  . Drug use: No     Review of Systems  Constitutional: No fever/chills Eyes: No visual changes. No discharge ENT: No upper respiratory complaints. Cardiovascular: no chest pain. Respiratory: no cough. No SOB. Gastrointestinal: No abdominal pain.  No nausea, no vomiting.  No diarrhea.  No constipation. Musculoskeletal: Negative for musculoskeletal pain. Skin: Negative for rash, abrasions, lacerations, ecchymosis. Neurological: Positive for syncope.  Denies headaches, focal weakness or numbness. 10-point ROS otherwise negative.  ____________________________________________   PHYSICAL EXAM:  VITAL SIGNS: ED Triage Vitals [08/12/20 1953]  Enc Vitals Group     BP 104/64     Pulse Rate  73     Resp 16     Temp 99 F (37.2 C)     Temp Source Oral     SpO2 100 %     Weight      Height      Head Circumference      Peak Flow      Pain Score      Pain Loc      Pain Edu?      Excl. in GC?      Constitutional: Alert and oriented. Well appearing and in no acute distress. Eyes: Conjunctivae are normal. PERRL. EOMI. Head: Atraumatic. ENT:      Ears:       Nose: No congestion/rhinnorhea.      Mouth/Throat: Mucous membranes are moist.  Neck: No  stridor.  No cervical spine tenderness to palpation.  Cardiovascular: Normal rate, regular rhythm. Normal S1 and S2.  Good peripheral circulation. Respiratory: Normal respiratory effort without tachypnea or retractions. Lungs CTAB. Good air entry to the bases with no decreased or absent breath sounds. Musculoskeletal: Full range of motion to all extremities. No gross deformities appreciated. Neurologic:  Normal speech and language. No gross focal neurologic deficits are appreciated.  Cranial nerves II through XII grossly intact.  Negative Romberg's and pronator drift. Skin:  Skin is warm, dry and intact. No rash noted. Psychiatric: Mood and affect are normal. Speech and behavior are normal. Patient exhibits appropriate insight and judgement.   ____________________________________________   LABS (all labs ordered are listed, but only abnormal results are displayed)  Labs Reviewed  BASIC METABOLIC PANEL - Abnormal; Notable for the following components:      Result Value   Potassium 3.4 (*)    All other components within normal limits  CBC - Abnormal; Notable for the following components:   WBC 11.8 (*)    All other components within normal limits  URINALYSIS, COMPLETE (UACMP) WITH MICROSCOPIC - Abnormal; Notable for the following components:   Color, Urine YELLOW (*)    APPearance HAZY (*)    Hgb urine dipstick SMALL (*)    Leukocytes,Ua TRACE (*)    All other components within normal limits  POC URINE PREG, ED  POCT PREGNANCY, URINE  CBG MONITORING, ED  TROPONIN I (HIGH SENSITIVITY)  TROPONIN I (HIGH SENSITIVITY)   ____________________________________________  EKG   ____________________________________________  RADIOLOGY   No results found.  ____________________________________________    PROCEDURES  Procedure(s) performed:    Procedures    Medications - No data to display   ____________________________________________   INITIAL IMPRESSION / ASSESSMENT  AND PLAN / ED COURSE  Pertinent labs & imaging results that were available during my care of the patient were reviewed by me and considered in my medical decision making (see chart for details).  Review of the [place] CSRS was performed in accordance of the [facility] prior to dispensing any controlled drugs.           Patient's diagnosis is consistent with vasovagal syncope.  Patient presented to the emergency department complaining of a syncopal episode after receiving her second Covid vaccination today.  Patient does have a history of syncopal episodes.  Overall exam and work-up is reassuring..  Patient is stable for discharge at this time.  No prescriptions necessary at this time.  Follow-up with primary care as needed.  Patient is given ED precautions to return to the ED for any worsening or new symptoms.     ____________________________________________  FINAL CLINICAL IMPRESSION(S) / ED  DIAGNOSES  Final diagnoses:  Vasovagal syncope      NEW MEDICATIONS STARTED DURING THIS VISIT:  ED Discharge Orders    None          This chart was dictated using voice recognition software/Dragon. Despite best efforts to proofread, errors can occur which can change the meaning. Any change was purely unintentional.    Lanette Hampshire 08/12/20 2138    Arnaldo Natal, MD 08/12/20 2158

## 2021-07-30 ENCOUNTER — Emergency Department
Admission: EM | Admit: 2021-07-30 | Discharge: 2021-07-30 | Disposition: A | Discharge status: Home / Self Care | Payer: Medicaid Other | Attending: Emergency Medicine | Admitting: Emergency Medicine

## 2021-07-30 ENCOUNTER — Other Ambulatory Visit: Payer: Self-pay

## 2021-07-30 DIAGNOSIS — R42 Dizziness and giddiness: Secondary | ICD-10-CM | POA: Insufficient documentation

## 2021-07-30 DIAGNOSIS — Z5321 Procedure and treatment not carried out due to patient leaving prior to being seen by health care provider: Secondary | ICD-10-CM | POA: Insufficient documentation

## 2021-07-30 DIAGNOSIS — R5383 Other fatigue: Secondary | ICD-10-CM | POA: Insufficient documentation

## 2021-07-30 DIAGNOSIS — R531 Weakness: Secondary | ICD-10-CM | POA: Insufficient documentation

## 2021-07-30 DIAGNOSIS — R55 Syncope and collapse: Secondary | ICD-10-CM | POA: Insufficient documentation

## 2021-07-30 LAB — CBC
HCT: 38 % (ref 36.0–46.0)
Hemoglobin: 12.6 g/dL (ref 12.0–15.0)
MCH: 29 pg (ref 26.0–34.0)
MCHC: 33.2 g/dL (ref 30.0–36.0)
MCV: 87.6 fL (ref 80.0–100.0)
Platelets: 386 10*3/uL (ref 150–400)
RBC: 4.34 MIL/uL (ref 3.87–5.11)
RDW: 13 % (ref 11.5–15.5)
WBC: 9.3 10*3/uL (ref 4.0–10.5)
nRBC: 0 % (ref 0.0–0.2)

## 2021-07-30 LAB — URINALYSIS, COMPLETE (UACMP) WITH MICROSCOPIC
Bacteria, UA: NONE SEEN
Bilirubin Urine: NEGATIVE
Glucose, UA: NEGATIVE mg/dL
Ketones, ur: NEGATIVE mg/dL
Nitrite: NEGATIVE
Protein, ur: NEGATIVE mg/dL
Specific Gravity, Urine: 1.008 (ref 1.005–1.030)
pH: 6 (ref 5.0–8.0)

## 2021-07-30 LAB — BASIC METABOLIC PANEL
Anion gap: 8 (ref 5–15)
BUN: 11 mg/dL (ref 6–20)
CO2: 24 mmol/L (ref 22–32)
Calcium: 9.2 mg/dL (ref 8.9–10.3)
Chloride: 105 mmol/L (ref 98–111)
Creatinine, Ser: 0.77 mg/dL (ref 0.44–1.00)
GFR, Estimated: >60 mL/min (ref >60)
Glucose, Bld: 101 mg/dL — ABNORMAL HIGH (ref 70–99)
Potassium: 3.5 mmol/L (ref 3.5–5.1)
Sodium: 137 mmol/L (ref 135–145)

## 2021-07-30 LAB — POC URINE PREG, ED: Preg Test, Ur: NEGATIVE

## 2021-07-30 LAB — TROPONIN I (HIGH SENSITIVITY): Troponin I (High Sensitivity): <2 ng/L (ref <18)

## 2021-07-30 NOTE — ED Notes (Signed)
No answer when called several times from lobby 

## 2021-07-30 NOTE — ED Triage Notes (Signed)
See first nurse note- Pt to ER via [facility]. Reports feeling dizzy at work, clocked out at [employer] and went to her car where she passed out. Unsure of duration of episode. Pt then passed out a total of two more times. Reports during episodes she is unable to move but is able to see and hear. Reports she then becomes weak and fatigued. Pt reports dizziness is now intermittent.   Hx of the same, reports having a negative work up.

## 2021-07-30 NOTE — ED Notes (Signed)
No answer when called several times from lobby; no answer when phone # listed in chart called 

## 2021-07-30 NOTE — ED Triage Notes (Signed)
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 She reports that had this before, can still hear and see just cant respond.   Vital signs stable, CGB 116 EKG unremarkable

## 2022-03-08 ENCOUNTER — Other Ambulatory Visit: Payer: Self-pay

## 2022-03-08 ENCOUNTER — Emergency Department: Payer: Self-pay

## 2022-03-08 ENCOUNTER — Encounter: Payer: Self-pay | Admitting: Emergency Medicine

## 2022-03-08 ENCOUNTER — Emergency Department
Admission: EM | Admit: 2022-03-08 | Discharge: 2022-03-08 | Disposition: A | Discharge status: Home / Self Care | Payer: Self-pay | Attending: Emergency Medicine | Admitting: Emergency Medicine

## 2022-03-08 DIAGNOSIS — X509XXA Other and unspecified overexertion or strenuous movements or postures, initial encounter: Secondary | ICD-10-CM | POA: Insufficient documentation

## 2022-03-08 DIAGNOSIS — S62346A Nondisplaced fracture of base of fifth metacarpal bone, right hand, initial encounter for closed fracture: Secondary | ICD-10-CM | POA: Insufficient documentation

## 2022-03-08 DIAGNOSIS — S62396A Other fracture of fifth metacarpal bone, right hand, initial encounter for closed fracture: Secondary | ICD-10-CM

## 2022-03-08 MED ORDER — KETOROLAC TROMETHAMINE 15 MG/ML IJ SOLN
15.0000 mg | Freq: Once | INTRAMUSCULAR | Status: AC
  Administered 2022-03-08: 15 mg via INTRAMUSCULAR
  Filled 2022-03-08: qty 1

## 2022-03-08 NOTE — ED Provider Notes (Signed)
? ?[facility] ?Provider Note ? ? ? Event Date/Time  ? First MD Initiated Contact with Patient 03/08/22 1001   ?  (approximate) ? ? ?History  ? ?Hand Pain ? ? ?HPI ? ?Courtney Sanders is a 31 y.o. female with history of tubal ligation who comes in with right hand and wrist discomfort.  Patient reports that she did feel some pop in her hand with lifting a pot yesterday and she is not sure if that is what made the pain worse.  Does report intermittent pain for a month prior to that.  She denies any numbness.  She just reports pain with trying to move the hand.  Denies any other injuries ? ?Physical Exam  ? ?Triage Vital Signs: ?ED Triage Vitals  ?Enc Vitals Group  ?   BP 03/08/22 0946 114/72  ?   Pulse Rate 03/08/22 0946 74  ?   Resp 03/08/22 0946 18  ?   Temp 03/08/22 0946 98.1 ?F (36.7 ?C)  ?   Temp Source 03/08/22 0946 Oral  ?   SpO2 03/08/22 0946 98 %  ?   Weight 03/08/22 0947 190 lb (86.2 kg)  ?   Height 03/08/22 0947 5\' 2"  (1.575 m)  ?   Head Circumference --   ?   Peak Flow --   ?   Pain Score 03/08/22 0947 6  ?   Pain Loc --   ?   Pain Edu? --   ?   Excl. in GC? --   ? ? ?Most recent vital signs: ?Vitals:  ? 03/08/22 0946  ?BP: 114/72  ?Pulse: 74  ?Resp: 18  ?Temp: 98.1 ?F (36.7 ?C)  ?SpO2: 98%  ? ? ? ?General: Awake, no distress.  ?CV:  Good peripheral perfusion.  ?Resp:  Normal effort.  ?Abd:  No distention.  ?Other:  2+ radial pulse.  Good cap refill.  Pain underneath the fifth, pinky digit more on the dorsal side.  Sensation intact with medial, ulnar, radial nerve intact movements.  No snuffbox tenderness.  No redness, no warmth.  No significant swelling ? ? ?ED Results / Procedures / Treatments  ? ?Labs ?(all labs ordered are listed, but only abnormal results are displayed) ?Labs Reviewed - No data to display ? ?: ? ? ? ?RADIOLOGY ?I have reviewed the xray personally and she has a comminuted fracture of the fifth proximal ? ? ?PROCEDURES: ? ?Critical Care performed:  No ? ?Procedures ? ? ?MEDICATIONS ORDERED IN ED: ?Medications  ?ketorolac (TORADOL) 15 MG/ML injection 15 mg (15 mg Intramuscular Given 03/08/22 1025)  ? ? ? ?IMPRESSION / MDM / ASSESSMENT AND PLAN / ED COURSE  ?I reviewed the triage vital signs and the nursing notes. ? ?We will get x-ray to evaluate for any fracture, dislocation but suspect could be musculoskeletal, inflammatory in nature.  We will give a dose of Toradol.  Patient declines pregnancy test given has had a tubal ligation. ? ?X-ray consistent with fracture.  Reevaluated patient she states that she did fall on a month ago but that she really only reinjured it by doing some weird movement yesterday.  She denies any concerns for not being safe at home or punching anything.  Patient is neurovascularly intact.  Will place patient in ulnar gutter splint and given orthopedic follow-up.  She expressed understanding felt comfortable with this plan ? ?Patient was last admitted in 2018 for pregnancy on review of my records. ? ? ? ? ? ?FINAL  CLINICAL IMPRESSION(S) / ED DIAGNOSES  ? ?Final diagnoses:  ?Closed nondisplaced fracture of other part of fifth metacarpal bone of right hand, initial encounter  ? ? ? ?Rx / DC Orders  ? ?ED Discharge Orders   ? ? None  ? ?  ? ? ? ?Note:  This document was prepared using Dragon voice recognition software and may include unintentional dictation errors. ?  ?Concha Se, MD ?03/08/22 1136 ? ?

## 2022-03-08 NOTE — ED Triage Notes (Signed)
Pt via POV from home. Pt c/o R hand, wrist, and forearm pain for about a month that got worse yesterday. Denies any strenuous activity. States she lifted a pot of water and heard a cracking noise. Pt is A&Ox4 and NAD ?

## 2022-03-08 NOTE — Discharge Instructions (Addendum)
?  Take Tylenol 1 g every 8 hours, ibuprofen 600 every 6-8 hours to help with pain.  Follow-up with orthopedic doctors.  Call them to make an appointment. ? ?IMPRESSION: ?Comminuted fracture is seen in the base of right fifth metacarpal. ?  ?

## 2022-03-08 NOTE — ED Notes (Signed)
See triage note  presents with pain to right hand and wrist  denies any specific injury   states pain has been there for about 1 week  states she felt a"pop" to that hand when lifting a pot   ?

## 2022-12-15 ENCOUNTER — Ambulatory Visit
Admission: RE | Admit: 2022-12-15 | Discharge: 2022-12-15 | Disposition: A | Discharge status: Home / Self Care | Payer: BC Managed Care – PPO | Attending: Family Medicine | Admitting: Family Medicine

## 2022-12-15 ENCOUNTER — Other Ambulatory Visit: Payer: Self-pay | Admitting: Family Medicine

## 2022-12-15 ENCOUNTER — Ambulatory Visit
Admission: RE | Admit: 2022-12-15 | Discharge: 2022-12-15 | Disposition: A | Discharge status: Home / Self Care | Payer: BC Managed Care – PPO | Source: Ambulatory Visit | Attending: Family Medicine | Admitting: Family Medicine

## 2022-12-15 DIAGNOSIS — R0789 Other chest pain: Secondary | ICD-10-CM

## 2023-09-08 ENCOUNTER — Encounter: Payer: Self-pay | Admitting: Podiatry

## 2023-09-08 ENCOUNTER — Other Ambulatory Visit: Payer: Self-pay

## 2023-09-08 ENCOUNTER — Ambulatory Visit (INDEPENDENT_AMBULATORY_CARE_PROVIDER_SITE_OTHER): Payer: BC Managed Care – PPO | Admitting: Podiatry

## 2023-09-08 ENCOUNTER — Emergency Department
Admission: EM | Admit: 2023-09-08 | Discharge: 2023-09-08 | Disposition: A | Discharge status: Home / Self Care | Payer: BC Managed Care – PPO | Attending: Emergency Medicine | Admitting: Emergency Medicine

## 2023-09-08 ENCOUNTER — Emergency Department: Payer: BC Managed Care – PPO

## 2023-09-08 VITALS — BP 111/76 | HR 71

## 2023-09-08 DIAGNOSIS — M79671 Pain in right foot: Secondary | ICD-10-CM | POA: Diagnosis present

## 2023-09-08 DIAGNOSIS — M7751 Other enthesopathy of right foot: Secondary | ICD-10-CM | POA: Diagnosis not present

## 2023-09-08 DIAGNOSIS — M25871 Other specified joint disorders, right ankle and foot: Secondary | ICD-10-CM

## 2023-09-08 MED ORDER — MELOXICAM 15 MG PO TABS: 15.0000 mg | ORAL_TABLET | Freq: Every day | ORAL | 0 refills | Status: AC

## 2023-09-08 MED ORDER — METHYLPREDNISOLONE 4 MG PO TBPK: ORAL_TABLET | ORAL | 0 refills | Status: AC

## 2023-09-08 MED ORDER — NAPROXEN 500 MG PO TABS: 500.0000 mg | ORAL_TABLET | Freq: Two times a day (BID) | ORAL | 0 refills | Status: AC

## 2023-09-08 MED ORDER — HYDROCODONE-ACETAMINOPHEN 5-325 MG PO TABS: 1.0000 | ORAL_TABLET | Freq: Four times a day (QID) | ORAL | 0 refills | Status: AC | PRN

## 2023-09-08 NOTE — Discharge Instructions (Signed)
Call make an appointment with Dr. Nicholes Rough who is on-call for podiatry today.  Your foot needs to be evaluated and rechecked by him.  A prescription for pain medication was sent to the pharmacy.  You cannot drive or operate machinery while taking this pain medication.  A prescription for naproxen was sent to the pharmacy also to be taken twice a day with food.  Elevate your foot often as needed for pain and swelling.  Also wear the cam walker boot for added support and protection of your foot.

## 2023-09-08 NOTE — ED Triage Notes (Signed)
Pt presents to ED with c/o of R foot pain with no injury. NAD noted. Pt state she walks a lot at work.

## 2023-09-08 NOTE — ED Provider Notes (Signed)
[facility] Provider Note    Event Date/Time   First MD Initiated Contact with Patient 09/08/23 [phone]     (approximate)   History   Foot Pain   HPI  Courtney Sanders is a 32 y.o. female   presents to the ED with complaint of right foot pain without history of injury.  Patient states that she saw her PCP a couple of days ago at which time hydrocodone was ordered.  She states this is not helping with the pain.  No history of prior injury.  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  Pain is worse later in the afternoon that it is first thing in the morning.  Patient has history of asthma and anemia.      Physical Exam   Triage Vital Signs: ED Triage Vitals  Encounter Vitals Group     BP 09/08/23 0815 (!) 126/90     Systolic BP Percentile --      Diastolic BP Percentile --      Pulse Rate 09/08/23 0814 68     Resp 09/08/23 0814 18     Temp 09/08/23 0814 98.5 F (36.9 C)     Temp Source 09/08/23 0814 Oral     SpO2 09/08/23 0814 98 %     Weight 09/08/23 0815 182 lb (82.6 kg)     Height 09/08/23 0815 5\' 2"  (1.575 m)     Head Circumference --      Peak Flow --      Pain Score 09/08/23 0814 9     Pain Loc --      Pain Education --      Exclude from Growth Chart --     Most recent vital signs: Vitals:   09/08/23 0814 09/08/23 0815  BP:  (!) 126/90  Pulse: 68   Resp: 18   Temp: 98.5 F (36.9 C)   SpO2: 98%      General: Awake, no distress. CV:  Good peripheral perfusion.  Resp:  Normal effort.  Abd:  No distention.  Other:   Moderate tenderness on palpation of the right MP joint area without erythema.  No gross deformity.  Skin is intact.  No soft tissue edema appreciated.  No tenderness is noted on palpation of the metatarsals 2 through 5.  Capillary refills less than 3 seconds.  DP and PT pulses are present.   ED Results / Procedures / Treatments   Labs (all labs ordered are listed, but only  abnormal results are displayed) Labs Reviewed - No data to display   RADIOLOGY Right foot x-ray images were reviewed by myself independent of the radiologist and negative for fracture, dislocation or foreign body.  Radiology report was reviewed and suggest for the first MP joint possible sesamoiditis.  Also findings which could reflect an anatomical variant of the second metatarsal head or possible avascular necrosis.    PROCEDURES:  Critical Care performed:   Procedures   MEDICATIONS ORDERED IN ED: Medications - No data to display   IMPRESSION / MDM / ASSESSMENT AND PLAN / ED COURSE  I reviewed the triage vital signs and the nursing notes.   Differential diagnosis includes, but is not limited to, right foot pain, right foot strain, right stress fracture, gout,  32 year old female presents to the ED with complaint of right foot pain without history of injury.  X-ray findings as noted above.  Patient clinically was not tender  in the second metatarsal area however I did speak to her about a follow-up visit with podiatry to exclude the possibility of more serious findings and she agrees.  Will treat with anti-inflammatories at this time and patient requested a small amount of hydrocodone to help her sleep.  Also a work note was written for her to have limited walking and a cam walker boot was applied.  Patient is to follow-up with Dr. Allena Katz who is on-call for podiatry today.      Patient's presentation is most consistent with acute complicated illness / injury requiring diagnostic workup.  FINAL CLINICAL IMPRESSION(S) / ED DIAGNOSES   Final diagnoses:  Acute pain of right foot     Rx / DC Orders   ED Discharge Orders          Ordered    HYDROcodone-acetaminophen (NORCO/VICODIN) 5-325 MG tablet  Every 6 hours PRN        09/08/23 1133    naproxen (NAPROSYN) 500 MG tablet  2 times daily with meals        09/08/23 1133             Note:  This document was prepared  using Dragon voice recognition software and may include unintentional dictation errors.   Tommi Rumps, PA-C 09/08/23 1345    Corena Herter, MD 09/10/23 1355

## 2023-09-08 NOTE — Progress Notes (Signed)
Subjective:  Patient ID: Courtney Sanders, female    DOB: 02/26/1991,  MRN: [id number]  Chief Complaint  Patient presents with   Foot Pain    "This part of my big toe is hurting.  I went to the ER this morning and they referred me to you guys."    32 y.o. female presents with the above complaint.  Patient presents with complaint of right first metatarsophalangeal joint pain/submetatarsal 1 pain.  She states that is starting she went to the ER this morning they referred her over to [place].  She has not seen MRIs prior to seeing me.  It is not red hot swollen joint.  She does not have any history of gout.  Denies any other acute complaints.   Review of Systems: Negative except as noted in the HPI. Denies N/V/F/Ch.  Past Medical History:  Diagnosis Date   Allergic rhinitis    Asthma    Iron deficiency anemia    Ovarian cyst     Current Outpatient Medications:    albuterol (PROVENTIL HFA;VENTOLIN HFA) 108 (90 Base) MCG/ACT inhaler, Inhale 2 puffs into the lungs 2 (two) times daily., Disp: , Rfl:    cetirizine (ZYRTEC) 10 MG tablet, Take 10 mg by mouth daily., Disp: , Rfl:    HYDROcodone-acetaminophen (NORCO/VICODIN) 5-325 MG tablet, Take 1 tablet by mouth every 6 (six) hours as needed for severe pain (pain score 7-10)., Disp: 10 tablet, Rfl: 0   meloxicam (MOBIC) 15 MG tablet, Take 1 tablet (15 mg total) by mouth daily., Disp: 30 tablet, Rfl: 0   methylPREDNISolone (MEDROL DOSEPAK) 4 MG TBPK tablet, Take as directed, Disp: 21 each, Rfl: 0   naproxen (NAPROSYN) 500 MG tablet, Take 1 tablet (500 mg total) by mouth 2 (two) times daily with a meal., Disp: 20 tablet, Rfl: 0  Social History   Tobacco Use  Smoking Status Never  Smokeless Tobacco Never    Allergies  Allergen Reactions   Diphenhydramine Hcl Shortness Of Breath and Palpitations   Unisom [Doxylamine Succinate (Sleep)]    Objective:   Vitals:   09/08/23 1542  BP: 111/76  Pulse: 71   There is no height or  weight on file to calculate BMI. Constitutional Well developed. Well nourished.  Vascular Dorsalis pedis pulses palpable bilaterally. Posterior tibial pulses palpable bilaterally. Capillary refill normal to all digits.  No cyanosis or clubbing noted. Pedal hair growth normal.  Neurologic Normal speech. Oriented to person, place, and time. Epicritic sensation to light touch grossly present bilaterally.  Dermatologic Nails well groomed and normal in appearance. No open wounds. No skin lesions.  Orthopedic: Pain on palpation to the right sesamoid complex.  Pain over the first metatarsophalangeal joint no deep intra-articular pain noted.  Pain with dorsiflexion of the big toe joint no pain with plantarflexion of the joint.   Radiographs: 3 views of skeletally mature adult right foot: Bipartite/possible sesamoiditis noted.  No fracture noted.Mild flattening of the second metatarsophalangeal joint noted.  No other bony abnormalities identified. Assessment:   1. Capsulitis of metatarsophalangeal (MTP) joint of right foot   2. Sesamoiditis of right foot    Plan:  Patient was evaluated and treated and all questions answered.  Right first MTP capsulitis versus sesamoiditis -All questions and concerns were discussed with the patient in extensive detail she will continue to wear boot for next 4 weeks.  I also believe she will benefit from a steroid injection to help decrease acute inflammatory component associate with pain.  Patient agrees with plan like proceed with steroid injection -A steroid injection was performed at right first MTP using 1% plain Lidocaine and 10 mg of Kenalog. This was well tolerated. -Will discuss orthotics management if there is improvement

## 2023-09-26 ENCOUNTER — Other Ambulatory Visit: Payer: Self-pay | Admitting: Otolaryngology

## 2023-09-27 ENCOUNTER — Encounter: Payer: Self-pay | Admitting: Otolaryngology

## 2023-09-28 NOTE — Anesthesia Preprocedure Evaluation (Addendum)
Anesthesia Evaluation  Patient identified by MRN, date of birth, ID band Patient awake    Reviewed: Allergy & Precautions, H&P , NPO status , Patient's Chart, lab work & pertinent test results  Airway Mallampati: II  TM Distance: >3 FB Neck ROM: Full    Dental no notable dental hx.    Pulmonary neg pulmonary ROS, asthma    Pulmonary exam normal breath sounds clear to auscultation       Cardiovascular negative cardio ROS Normal cardiovascular exam Rhythm:Regular Rate:Normal     Neuro/Psych negative neurological ROS  negative psych ROS   GI/Hepatic negative GI ROS, Neg liver ROS,,,  Endo/Other  negative endocrine ROS    Renal/GU negative Renal ROS  negative genitourinary   Musculoskeletal negative musculoskeletal ROS (+)    Abdominal   Peds negative pediatric ROS (+)  Hematology negative hematology ROS (+) Blood dyscrasia, anemia   Anesthesia Other Findings Asthma  Ovarian cyst Allergic rhinitis  Iron deficiency anemia    Reproductive/Obstetrics negative OB ROS                             Anesthesia Physical Anesthesia Plan  ASA: 2  Anesthesia Plan: General ETT   Post-op Pain Management:    Induction: Intravenous  PONV Risk Score and Plan:   Airway Management Planned: Oral ETT  Additional Equipment:   Intra-op Plan:   Post-operative Plan: Extubation in OR  Informed Consent: I have reviewed the patients History and Physical, chart, labs and discussed the procedure including the risks, benefits and alternatives for the proposed anesthesia with the patient or authorized representative who has indicated his/her understanding and acceptance.     Dental Advisory Given  Plan Discussed with: Anesthesiologist, CRNA and Surgeon  Anesthesia Plan Comments: (Patient consented for risks of anesthesia including but not limited to:  - adverse reactions to medications - damage to  eyes, teeth, lips or other oral mucosa - nerve damage due to positioning  - sore throat or hoarseness - Damage to heart, brain, nerves, lungs, other parts of body or loss of life  Patient voiced understanding and assent.)       Anesthesia Quick Evaluation

## 2023-09-30 NOTE — Discharge Instructions (Signed)
T & A INSTRUCTION SHEET - [facility]  AUSTIN ROSE, MD    INFORMATION SHEET FOR A TONSILLECTOMY AND ADENDOIDECTOMY  About Your Tonsils and Adenoids  The tonsils and adenoids are normal body tissues that are part of our immune system.  They normally help to protect [place] against diseases that may enter our mouth and nose. However, sometimes the tonsils and/or adenoids become too large and obstruct our breathing, especially at night.    If either of these things happen it helps to remove the tonsils and adenoids in order to become healthier. The operation to remove the tonsils and adenoids is called a tonsillectomy and adenoidectomy.  The Location of Your Tonsils and Adenoids  The tonsils are located in the back of the throat on both side and sit in a cradle of muscles. The adenoids are located in the roof of the mouth, behind the nose, and closely associated with the opening of the Eustachian tube to the ear.  Surgery on Tonsils and Adenoids  A tonsillectomy and adenoidectomy is a short operation which takes about thirty minutes.  This includes being put to sleep and being awakened. Tonsillectomies and adenoidectomies are performed at [facility] and may require observation period in the recovery room prior to going home. Children are required to remain in recovery for at least 45 minutes.   Following the Operation for a Tonsillectomy  A cautery machine is used to control bleeding. Bleeding from a tonsillectomy and adenoidectomy is minimal and postoperatively the risk of bleeding is approximately four percent, although this rarely life threatening.  After your tonsillectomy and adenoidectomy post-op care at home: 1. Our patients are able to go home the same day. You may be given prescriptions for pain medications, if indicated. 2. It is extremely important to remember that fluid intake is of utmost importance after a tonsillectomy. The  amount that you drink must be maintained in the postoperative period. A good indication of whether a child is getting enough fluid is whether his/her urine output is constant. As long as children are urinating or wetting their diaper every 6 - 8 hours this is usually enough fluid intake.   3. Although rare, this is a risk of some bleeding in the first ten days after surgery. This usually occurs between day five and nine postoperatively. This risk of bleeding is approximately four percent. If you or your child should have any bleeding you should remain calm and notify our office or go directly to the emergency room at [facility] where they will contact us. Our doctors are available seven days a week for notification. We recommend sitting up quietly in a chair, place an ice pack on the front of the neck and spitting out the blood gently until we are able to contact you. Adults should gargle gently with ice water and this may help stop the bleeding. If the bleeding does not stop after a short time, i.e. 10 to 15 minutes, or seems to be increasing again, please contact us or go to the hospital.   4. It is common for the pain to be worse at 5 - 7 days postoperatively. This occurs because the "scab" is peeling off and the mucous membrane (skin of the throat) is growing back where the tonsils were.   5. It is common for a low-grade fever, less than 102, during the first week after a tonsillectomy and adenoidectomy. It is usually due to  not drinking enough liquids, and we suggest your use liquid Tylenol (acetaminophen) or the pain medicine with Tylenol (acetaminophen) prescribed in order to keep your temperature below 102. Please follow the directions on the back of the bottle. 6. Recommendations for post-operative pain in children and adults: a) For Children 12 and younger: Recommendations are for oral Tylenol (acetaminophen) and oral Motrin (ibuprofen). Administer the Tylenol (acetaminophen) and  Motrin as stated on bottle for patient's age/weight. Sometimes it may be necessary to alternate the Tylenol (acetaminophen) and Motrin for improved pain control. Motrin (ibuprofen) does last slightly longer so many patients benefit from being given this prior to bedtime. All children should avoid Aspirin products for 2 weeks following surgery. b) For children over the age of 92: Tylenol (acetaminophen) is the preferred first choice for pain control. Depending on your child's size, sometimes they will be given a combination of Tylenol (acetaminophen) and hydrocodone medication or sometimes it will be recommended they take Motrin (ibuprofen) in addition to the Tylenol (acetaminophen). Narcotics should always be used with caution in children following surgery as they can suppress their breathing and switching to over the counter Tylenol (acetaminophen) and Motrin (ibuprofen) as soon as possible is recommended. All patients should avoid Aspirin products for 2 weeks following surgery. c) Adults: Usually adults will require a narcotic pain medication following a tonsillectomy. This usually has either hydrocodone or oxycodone in it and can usually be taken every 4 to 6 hours as needed for moderate pain. If the medication does not have Tylenol (acetaminophen) in it, you may also supplement Tylenol (acetaminophen) as needed every 4 to 6 hours for breakthrough or mild pain. Adults should avoid Aspirin, Aleve, Motrin, and Ibuprofen products for 2 weeks following surgery as they can increase your risk of bleeding. 7. If you happen to look in the mirror or into your child's mouth you will see white/gray patches on the back of the throat. This is what a scab looks like in the mouth and is normal after having a tonsillectomy and adenoidectomy. They will disappear once the tonsil areas heal completely. However, it may cause a noticeable odor, and this too will disappear with time.     8. You or your child may experience ear  pain after having a tonsillectomy and adenoidectomy.  This is called referred pain and comes from the throat, but it is felt in the ears.  Ear pain is quite common and expected. It will usually go away after ten days. There is usually nothing wrong with the ears, and it is primarily due to the healing area stimulating the nerve to the ear that runs along the side of the throat. Use either the prescribed pain medicine or Tylenol (acetaminophen) as needed.  9. The throat tissues after a tonsillectomy are obviously sensitive. Smoking around children who have had a tonsillectomy significantly increases the risk of bleeding. DO NOT SMOKE!  What to Expect Each Day  First Day at Home 1. Patients will be discharged home the same day.  2. Drink at least four glasses of liquid a day. Clear, cool liquids are recommended. Fruit juices containing citric acid are not recommended because they tend to cause pain. Carbonated beverages are allowed if you pour them from glass to glass to remove the bubbles as these tend to cause discomfort. Avoid alcoholic beverages.  3. Eat very soft foods such as soups, broth, jello, custard, pudding, ice cream, popsicles, applesauce, mashed potatoes, and in general anything that you can crush between your  tongue and the roof of your mouth. Try adding [employer] Mix into your food for extra calories. It is not uncommon to lose 5 to 10 pounds of fluid weight. The weight will be gained back quickly once you're feeling better and drinking more.  4. Sleep with your head elevated on two pillows for about three days to help decrease the swelling.  5. DO NOT SMOKE!  Day Two  1. Rest as much as possible. Use common sense in your activities.  2. Continue drinking at least four glasses of liquid per day.  3. Follow the soft diet.  4. Use your pain medication as needed.  Day Three  1. Advance your activity as you are able and continue to follow the previous day's suggestions.   Days Four Through Six  1. Advance your diet and begin to eat more solid foods such as chopped hamburger. 2. Advance your activities slowly. Children should be kept mostly around the house.  3. Not uncommonly, there will be more pain at this time. It is temporary, usually lasting a day or two.  Day Seven Through Ten  1. Most individuals by this time are able to return to work or school unless otherwise instructed. Consider sending children back to school for a half day on the first day back.

## 2023-10-03 NOTE — H&P (Signed)
HPI: This is a 32 year old female who: is being seen for a chief complaint of tonsillolithiasis. The tonsil stones are located on both tonsils. The patient is referred by Courtney Sanders, who requested a consult for evaluation of tonsillolithiasis. She has tonsilloliths that are unchanged and moderate in severity. The tonsilloliths have been present for 1 year. Patient is in for recurring tonsil stones that have been a problem for about a year. These are present when she wakes up in the mornings. She notes they are very painful, and when she has them her tonsils will swell up to nearly touching in the midline. She notes the pain will also radiate to her left ear and into her neck. She notes she works in a loud environment, and the sounds are irritating in that left ear. ---- Courtney Sanders is a pleasant 32 yo woman with history of chronic tonsillitis and frequent difficulty with tonsolithiasis bilaterally. She is otherwise very healthy. No prior hx of smoking or medical problems. PSHx positive only for hx of cholecystectomy with no complications. 1. Vitals: Date Taken By B.P. Pulse Resp. O2 Sat. Temp. Ht. Wt. BMI BSA 09/23/23 16:17 WILLIFORD, JOSHUA 122/74 SIT 98.2 F 62.0 in* 185.0 lbs* 33.8 1.9 FiO2 * Patient Reported Exam: An Otolaryngologic exam was performed Otolaryngologic exam External Ears: external ear examination of normal size and morphology without traumatic or congenital deformity AD, external ear examination of normal size and morphology without traumatic or congenital deformity AS. External ear canal AD: Normal EAC exam External ear canal AS: Normal EAC exam Tympanic membrane AD: AD tympanic membrane intact, no fluid, normal mobility on pneumotoscopy Tympanic membrane AS: AS tympanic membrane intact, no fluid, normal mobility on pneumotoscopy External Nose: Nasal dorsum midline Right Nasal Cavity: right intranasal examination normal without turbinate hypertrophy,  masses, septal deformity or synechiae Left nasal cavity: left intranasal examination normal without turbinate hypertrophy, masses, septal deformity or synechiae Lips, Teeth, Gums: normal lip morphology and anatomy, class I occlusion, no dental abnormalities Visit Note - September 23, 2023 Courtney Sanders, Courtney Sanders MRN: [id number] DOB: [birth year] Sex: Female PMS ID: [id number] [name] (Primary Provider) [facility] Under) Page 2 [phone] Work [phone] Fax [name] [place] [address] [place], [place] [address] 2024. Cholecystectomy Oral cavity/Oropharynx: Cryptic appearing tonsils bilaterally c/w chronic tonsillitis The remainder of the oral cavity and oropharyngeal exam (buccal mucosa, tongue, floor of mouth, hard and soft palates, tonsils, posterior and lateral pharyngeal walls) is normal with the exception of the above findings. Head Inspection: Normal head inspection with normal head shape, without masses or concerning lesions.  Head Palpation: Normal head inspection without masses, palpable deformities, or concerning lesions. Salivary: No palpable salivary gland masses - no erythema or tenderness. Facial nerve intact and symmetric bilaterally. Neck: normal neck examination without skin masses, tenderness or crepitus  Thyroid: normal thyroid examination without masses or nodules Respiratory Effort: normal respiratory effort without labored breathing or accessory muscle use  Neck Lymph Node: normal lymphatic exam without lymphadenopathy in cranial or cervical regions Neuro - Cranial Nerves: Cranial nerves II-XII intact.  Chest - clear to auscultation bilaterally C/V - regular rate and rhythm without murmur    Appearance: Small for age of 50 mos, but appears alert - can support head. No stridor or airway distress. Communication: normal vocal quality and ability to communicate Orientation: Alert and oriented to person, place,  time. Mood:mood and affect well-adjusted, pleasant and cooperative, appropriate for clinical and encounter circumstances  Impression/Plan: Hypertrophy of tonsils Hypertrophy of tonsils (J35.1) Plan: Counseling - Hypertrophy of tonsils. Please refer to the education handout for detailed counseling. 1. Tonsillolithiasis Other chronic diseases of tonsils and adenoids (J35.8) Plan: Treatment Regimen. Begin the following treatments: ** Recommend to OR for bilateral tonsillectomy. Anticipate [facility] or [place] [facility] OR and RTC - Dr. Okey Dupre at [facility] office 3-4 weeks postop.. Care Regimen: Discussed the risks, benefits, and options of this procedure with the patient / family today - they understand and agree to proceed. The patient / family does have all of our contact information if needed.  [name]. Okey Dupre, MD, MBA, [facility] [place] [facility] [phone]

## 2023-10-04 ENCOUNTER — Ambulatory Visit: Payer: BC Managed Care – PPO | Admitting: Anesthesiology

## 2023-10-04 ENCOUNTER — Encounter: Payer: Self-pay | Admitting: Otolaryngology

## 2023-10-04 ENCOUNTER — Encounter
Admission: RE | Disposition: A | Discharge status: Home / Self Care | Payer: Self-pay | Source: Home / Self Care | Attending: Otolaryngology

## 2023-10-04 ENCOUNTER — Other Ambulatory Visit: Payer: Self-pay

## 2023-10-04 ENCOUNTER — Ambulatory Visit
Admission: RE | Admit: 2023-10-04 | Discharge: 2023-10-04 | Disposition: A | Discharge status: Home / Self Care | Payer: BC Managed Care – PPO | Attending: Otolaryngology | Admitting: Otolaryngology

## 2023-10-04 DIAGNOSIS — J358 Other chronic diseases of tonsils and adenoids: Secondary | ICD-10-CM | POA: Insufficient documentation

## 2023-10-04 DIAGNOSIS — J3501 Chronic tonsillitis: Secondary | ICD-10-CM | POA: Insufficient documentation

## 2023-10-04 HISTORY — PX: TONSILLECTOMY: SHX5217

## 2023-10-04 LAB — POCT PREGNANCY, URINE: Preg Test, Ur: NEGATIVE

## 2023-10-04 SURGERY — TONSILLECTOMY
Anesthesia: General | Laterality: Bilateral

## 2023-10-04 MED ORDER — LIDOCAINE HCL (PF) 2 % IJ SOLN
INTRAMUSCULAR | Status: AC
  Filled 2023-10-04: qty 10

## 2023-10-04 MED ORDER — DEXMEDETOMIDINE HCL IN NACL 200 MCG/50ML IV SOLN
INTRAVENOUS | Status: DC | PRN
  Administered 2023-10-04: 10 ug via INTRAVENOUS
  Administered 2023-10-04: 6 ug via INTRAVENOUS
  Administered 2023-10-04: 16 ug via INTRAVENOUS
  Administered 2023-10-04: 4 ug via INTRAVENOUS

## 2023-10-04 MED ORDER — DEXAMETHASONE SODIUM PHOSPHATE 4 MG/ML IJ SOLN
INTRAMUSCULAR | Status: AC
  Filled 2023-10-04: qty 1

## 2023-10-04 MED ORDER — PROPOFOL 10 MG/ML IV BOLUS
INTRAVENOUS | Status: AC
  Filled 2023-10-04: qty 40

## 2023-10-04 MED ORDER — LACTATED RINGERS IV SOLN: INTRAVENOUS | Status: DC | PRN

## 2023-10-04 MED ORDER — ONDANSETRON HCL 4 MG/2ML IJ SOLN
INTRAMUSCULAR | Status: AC
Start: 2023-10-04 — End: ?
  Filled 2023-10-04: qty 2

## 2023-10-04 MED ORDER — OXYCODONE HCL 5 MG/5ML PO SOLN
10.0000 mg | Freq: Once | ORAL | Status: AC
  Administered 2023-10-04: 10 mg via ORAL

## 2023-10-04 MED ORDER — MIDAZOLAM HCL 2 MG/2ML IJ SOLN
INTRAMUSCULAR | Status: AC
  Filled 2023-10-04: qty 2

## 2023-10-04 MED ORDER — ROCURONIUM BROMIDE 10 MG/ML (PF) SYRINGE
PREFILLED_SYRINGE | INTRAVENOUS | Status: AC
  Filled 2023-10-04: qty 10

## 2023-10-04 MED ORDER — ARTIFICIAL TEARS OPHTHALMIC OINT
TOPICAL_OINTMENT | OPHTHALMIC | Status: AC
Start: 2023-10-04 — End: ?
  Filled 2023-10-04: qty 3.5

## 2023-10-04 MED ORDER — OXYCODONE HCL 5 MG/5ML PO SOLN
ORAL | Status: AC
  Filled 2023-10-04: qty 10

## 2023-10-04 MED ORDER — ACETAMINOPHEN 10 MG/ML IV SOLN
INTRAVENOUS | Status: AC
  Filled 2023-10-04: qty 100

## 2023-10-04 MED ORDER — DEXAMETHASONE SODIUM PHOSPHATE 4 MG/ML IJ SOLN
INTRAMUSCULAR | Status: AC
  Filled 2023-10-04: qty 2

## 2023-10-04 MED ORDER — ONDANSETRON HCL 4 MG/2ML IJ SOLN
INTRAMUSCULAR | Status: AC
  Filled 2023-10-04: qty 2

## 2023-10-04 MED ORDER — PROPOFOL 10 MG/ML IV BOLUS
INTRAVENOUS | Status: AC
  Filled 2023-10-04: qty 20

## 2023-10-04 MED ORDER — PROPOFOL 10 MG/ML IV BOLUS
INTRAVENOUS | Status: DC | PRN
  Administered 2023-10-04 (×2): 50 mg via INTRAVENOUS
  Administered 2023-10-04: 200 mg via INTRAVENOUS
  Administered 2023-10-04: 100 mg via INTRAVENOUS

## 2023-10-04 MED ORDER — LIDOCAINE HCL (CARDIAC) PF 100 MG/5ML IV SOSY
PREFILLED_SYRINGE | INTRAVENOUS | Status: DC | PRN
  Administered 2023-10-04: 50 mg via INTRAVENOUS

## 2023-10-04 MED ORDER — BACITRACIN 500 UNIT/GM EX OINT
TOPICAL_OINTMENT | CUTANEOUS | Status: DC | PRN
  Administered 2023-10-04: 1 via TOPICAL

## 2023-10-04 MED ORDER — FENTANYL CITRATE (PF) 100 MCG/2ML IJ SOLN
INTRAMUSCULAR | Status: AC
  Filled 2023-10-04: qty 2

## 2023-10-04 MED ORDER — DEXMEDETOMIDINE HCL IN NACL 80 MCG/20ML IV SOLN
INTRAVENOUS | Status: AC
Start: 2023-10-04 — End: ?
  Filled 2023-10-04: qty 20

## 2023-10-04 MED ORDER — OXYCODONE HCL 5 MG/5ML PO SOLN: 5.0000 mg | ORAL | 0 refills | Status: AC | PRN

## 2023-10-04 MED ORDER — MIDAZOLAM HCL 5 MG/5ML IJ SOLN
INTRAMUSCULAR | Status: DC | PRN
  Administered 2023-10-04: 2 mg via INTRAVENOUS

## 2023-10-04 MED ORDER — FENTANYL CITRATE (PF) 100 MCG/2ML IJ SOLN
INTRAMUSCULAR | Status: DC | PRN
  Administered 2023-10-04: 100 ug via INTRAVENOUS

## 2023-10-04 MED ORDER — SUCCINYLCHOLINE CHLORIDE 200 MG/10ML IV SOSY
PREFILLED_SYRINGE | INTRAVENOUS | Status: DC | PRN
  Administered 2023-10-04: 120 mg via INTRAVENOUS

## 2023-10-04 MED ORDER — DEXAMETHASONE SODIUM PHOSPHATE 4 MG/ML IJ SOLN
INTRAMUSCULAR | Status: DC | PRN
  Administered 2023-10-04: 8 mg via INTRAVENOUS

## 2023-10-04 MED ORDER — ACETAMINOPHEN 10 MG/ML IV SOLN
1000.0000 mg | Freq: Once | INTRAVENOUS | Status: AC
  Administered 2023-10-04: 1000 mg via INTRAVENOUS

## 2023-10-04 MED ORDER — ONDANSETRON HCL 4 MG/2ML IJ SOLN
INTRAMUSCULAR | Status: DC | PRN
  Administered 2023-10-04: 4 mg via INTRAVENOUS

## 2023-10-04 MED ORDER — SEVOFLURANE IN SOLN
RESPIRATORY_TRACT | Status: AC
  Filled 2023-10-04: qty 250

## 2023-10-04 MED ORDER — 0.9 % SODIUM CHLORIDE (POUR BTL) OPTIME
TOPICAL | Status: DC | PRN
Start: 2023-10-04 — End: 2023-10-04
  Administered 2023-10-04: 100 mL

## 2023-10-04 MED ORDER — EPHEDRINE 5 MG/ML INJ
INTRAVENOUS | Status: AC
  Filled 2023-10-04: qty 5

## 2023-10-04 SURGICAL SUPPLY — 15 items
ANTIFOG SOL W/FOAM PAD STRL (MISCELLANEOUS) ×1
BLADE ELECT COATED/INSUL 125 (ELECTRODE) ×1 IMPLANT
CANISTER SUCT 1200ML W/VALVE (MISCELLANEOUS) ×1 IMPLANT
CATH ROBINSON RED A/P 10FR (CATHETERS) ×1 IMPLANT
COAGULATOR SUCTION 6 10FR HC (MISCELLANEOUS) IMPLANT
CORD BIP STRL DISP 12FT (MISCELLANEOUS) IMPLANT
ELECT REM PT RETURN 9FT ADLT (ELECTROSURGICAL) ×1
ELECTRODE REM PT RTRN 9FT ADLT (ELECTROSURGICAL) ×1 IMPLANT
GLOVE SURG GAMMEX PI TX LF 7.5 (GLOVE) ×1 IMPLANT
KIT TURNOVER KIT A (KITS) ×1 IMPLANT
PACK TONSIL AND ADENOID CUSTOM (PACKS) ×1 IMPLANT
SLEEVE SUCTION 125 (MISCELLANEOUS) ×1 IMPLANT
SOLUTION ANTFG W/FOAM PAD STRL (MISCELLANEOUS) ×1 IMPLANT
SPONGE TONSIL 1 RF SGL (DISPOSABLE) ×1 IMPLANT
STRAP BODY AND KNEE 60X3 (MISCELLANEOUS) ×1 IMPLANT

## 2023-10-04 NOTE — Op Note (Signed)
OPERATIVE REPORT   Attending Physician: [name]. Okey Dupre, MD, MBA, FARS    Pediatric Otolaryngology   Preoperative Diagnosis: Chronic / recurrent tonsillitis. Chronic tonsolithiasis. Postoperative Diagnosis: Same.    Procedure(s) Performed:  Bilateral Tonsillectomy, CPT [id number]  Teaching Surgeon: [name]. Okey Dupre, MD, MBA, FARS Assistants: None Dictated   Anesthesia: General  Specimens: Bilateral tonsil tissue Drains:  None. Estimated Blood Loss: Less than 15 mL.  Operative Findings:  3+ cryptic tonsils  Procedure:  After informed consent was obtained, the patient was brought from the preoperative holding area to the operating room and placed supine on the operating room table. After smooth induction of general anesthesia, a timeout was performed and all parties were in agreement.   Attention was then turned to the tonsillectomy porion of the procedure.  The patient was turned 90 degrees away from anesthesia and suspended in the Tevita Gomer position using a tongue retractor and mouth gag, gently on the Mayo stand.  The palate was noted to be without submucosal cleft or bifid uvula.  A Foley catheter was then placed through the right nare and tied around the soft palate allowing visualization of the nasopharynx which was noted to be within normal limits.  The tonsils were then excised bilaterally using bovie electrocautery without difficulty.  The oral cavity, oropharynx and nasopharynx were then copiously irrigated with sterile saline.  Adequate hemostasis was assured using both monopolar and bipolar electrocautery and the tongue retractor and mouth guard removed.  The stomach was suctioned, and the patient turned 90 degrees back towards anesthesia.   The patient's care was turned over to the Anesthesia team who successfully awakened the patient without event. The patient was transported to the PACU in stable condition. All instrument, sharp and lap counts were correct at the end of the case.    Teaching Surgeon Attestation:  I was present and performed the entire procedure.   [name]. Okey Dupre, MD, MBA, [facility] Otolaryngology-Head & Neck Surgery [facility] [phone]

## 2023-10-04 NOTE — Transfer of Care (Signed)
Immediate Anesthesia Transfer of Care Note  Patient: Courtney Sanders  Procedure(s) Performed: TONSILLECTOMY (Bilateral)  Patient Location: PACU  Anesthesia Type: General ETT  Level of Consciousness: awake, alert  and patient cooperative  Airway and Oxygen Therapy: Patient Spontanous Breathing and Patient connected to supplemental oxygen  Post-op Assessment: Post-op Vital signs reviewed, Patient's Cardiovascular Status Stable, Respiratory Function Stable, Patent Airway and No signs of Nausea or vomiting  Post-op Vital Signs: Reviewed and stable  Complications: No notable events documented.

## 2023-10-04 NOTE — Interval H&P Note (Signed)
History and Physical Interval Note:  10/04/2023 11:48 AM  Courtney Sanders  has presented today for surgery, with the diagnosis of Hypertrophy of tonsils Tonsillolithiasis.  The various methods of treatment have been discussed with the patient and family. After consideration of risks, benefits and other options for treatment, the patient has consented to  Procedure(s): TONSILLECTOMY (Bilateral) as a surgical intervention.  The patient's history has been reviewed, patient examined, no change in status, stable for surgery.  I have reviewed the patient's chart and labs.  Questions were answered to the patient's satisfaction.     Reola Mosher S  No changes to H&P.   [name]. Okey Dupre, MD, MBA, [facility] Otolaryngology-Head & Neck [facility] [phone]

## 2023-10-04 NOTE — Anesthesia Postprocedure Evaluation (Signed)
Anesthesia Post Note  Patient: Courtney Sanders  Procedure(s) Performed: TONSILLECTOMY (Bilateral)  Patient location during evaluation: PACU Anesthesia Type: General Level of consciousness: awake and alert Pain management: pain level controlled Vital Signs Assessment: post-procedure vital signs reviewed and stable Respiratory status: spontaneous breathing, nonlabored ventilation, respiratory function stable and patient connected to nasal cannula oxygen Cardiovascular status: blood pressure returned to baseline and stable Postop Assessment: no apparent nausea or vomiting Anesthetic complications: no   No notable events documented.   Last Vitals:  Vitals:   10/04/23 1350 10/04/23 1355  BP:  116/77  Pulse: 66 65  Resp: 17 17  Temp:  36.7 C  SpO2: 98% 98%    Last Pain:  Vitals:   10/04/23 1355  TempSrc:   PainSc: 8                  Hosanna Betley C Malayiah Mcbrayer

## 2023-10-04 NOTE — Anesthesia Procedure Notes (Signed)
Procedure Name: Intubation Date/Time: 10/04/2023 12:10 PM  Performed by: Domenic Moras, CRNAPre-anesthesia Checklist: Patient identified, Emergency Drugs available, Suction available and Patient being monitored Patient Re-evaluated:Patient Re-evaluated prior to induction Oxygen Delivery Method: Circle system utilized Preoxygenation: Pre-oxygenation with 100% oxygen Induction Type: IV induction Ventilation: Mask ventilation without difficulty Laryngoscope Size: Mac and 3 Grade View: Grade I Tube type: Oral Rae Tube size: 5.0 mm Number of attempts: 1 Airway Equipment and Method: Stylet and Oral airway Placement Confirmation: ETT inserted through vocal cords under direct vision, positive ETCO2 and breath sounds checked- equal and bilateral Secured at: 22 cm Tube secured with: Tape Dental Injury: Teeth and Oropharynx as per pre-operative assessment

## 2023-10-05 ENCOUNTER — Other Ambulatory Visit: Payer: Self-pay | Admitting: Podiatry

## 2023-10-05 ENCOUNTER — Encounter: Payer: Self-pay | Admitting: Otolaryngology

## 2023-10-05 LAB — SURGICAL PATHOLOGY

## 2023-10-11 ENCOUNTER — Encounter: Payer: Self-pay | Admitting: Podiatry

## 2023-10-11 ENCOUNTER — Ambulatory Visit (INDEPENDENT_AMBULATORY_CARE_PROVIDER_SITE_OTHER): Payer: BC Managed Care – PPO | Admitting: Podiatry

## 2023-10-11 DIAGNOSIS — Z01818 Encounter for other preprocedural examination: Secondary | ICD-10-CM

## 2023-10-11 DIAGNOSIS — M2011 Hallux valgus (acquired), right foot: Secondary | ICD-10-CM

## 2023-10-11 NOTE — Progress Notes (Signed)
Subjective:  Patient ID: Courtney Sanders, female    DOB: 1991/03/05,  MRN: [id number]  Chief Complaint  Patient presents with   Foot Pain    "I can't wear shoes."    32 y.o. female presents with the above complaint.  Patient presents with complaint of right moderate bunion deformity painful to touch she said the injection helped some but did not give out of her pain.  She wanted to discuss treatment options she has not seen anyone else prior to seeing me.  She has failed all conservative care including shoe gear modification injection padding protecting offloading she wishes surgical intervention at this time  Review of Systems: Negative except as noted in the HPI. Denies N/V/F/Ch.  Past Medical History:  Diagnosis Date   Allergic rhinitis    Asthma    Iron deficiency anemia    Ovarian cyst     Current Outpatient Medications:    budesonide-formoterol (SYMBICORT) 80-4.5 MCG/ACT inhaler, Inhale 2 puffs into the lungs 2 (two) times daily as needed., Disp: , Rfl:    HYDROcodone-acetaminophen (NORCO/VICODIN) 5-325 MG tablet, Take 1 tablet by mouth every 6 (six) hours as needed for severe pain (pain score 7-10)., Disp: 10 tablet, Rfl: 0   methylPREDNISolone (MEDROL DOSEPAK) 4 MG TBPK tablet, Take as directed, Disp: 21 each, Rfl: 0   oxyCODONE (ROXICODONE) 5 MG/5ML solution, Take 5 mLs (5 mg total) by mouth every 4 (four) hours as needed for up to 30 doses for severe pain (pain score 7-10)., Disp: 150 mL, Rfl: 0   albuterol (PROVENTIL HFA;VENTOLIN HFA) 108 (90 Base) MCG/ACT inhaler, Inhale 2 puffs into the lungs 2 (two) times daily. (Patient not taking: Reported on 09/27/2023), Disp: , Rfl:    cetirizine (ZYRTEC) 10 MG tablet, Take 10 mg by mouth daily. (Patient not taking: Reported on 09/27/2023), Disp: , Rfl:    meloxicam (MOBIC) 15 MG tablet, Take 1 tablet (15 mg total) by mouth daily. (Patient not taking: Reported on 09/27/2023), Disp: 30 tablet, Rfl: 0   naproxen  (NAPROSYN) 500 MG tablet, Take 1 tablet (500 mg total) by mouth 2 (two) times daily with a meal. (Patient not taking: Reported on 09/27/2023), Disp: 20 tablet, Rfl: 0  Social History   Tobacco Use  Smoking Status Never  Smokeless Tobacco Never    Allergies  Allergen Reactions   Diphenhydramine Hcl Shortness Of Breath and Palpitations   Unisom [Doxylamine Succinate (Sleep)] Palpitations   Objective:  There were no vitals filed for this visit. There is no height or weight on file to calculate BMI. Constitutional Well developed. Well nourished.  Vascular Dorsalis pedis pulses palpable bilaterally. Posterior tibial pulses palpable bilaterally. Capillary refill normal to all digits.  No cyanosis or clubbing noted. Pedal hair growth normal.  Neurologic Normal speech. Oriented to person, place, and time. Epicritic sensation to light touch grossly present bilaterally.  Dermatologic Nails well groomed and normal in appearance. No open wounds. No skin lesions.  Orthopedic: Normal joint ROM without pain or crepitus bilaterally. Hallux abductovalgus deformity present this is a track bound not a tracking deformity.  No intra-articular first MPJ pain noted moderate bunion deformity noted Left 1st MPJ diminished range of motion. Left 1st TMT without gross hypermobility. Right 1st MPJ diminished range of motion  Right 1st TMT without gross hypermobility. Lesser digital contractures present bilaterally.   Radiographs: Taken and reviewed. Hallux abductovalgus deformity present. Metatarsal parabola normal. 1st/2nd IMA: Moderate; TSP: 6 5 out of 7  Assessment:   1.  Hav (hallux abducto valgus), right   2. Encounter for preoperative examination for general surgical procedure    Plan:  Patient was evaluated and treated and all questions answered.  Hallux abductovalgus deformity, right -XR as above. -Patient has failed all conservative therapy and wishes to proceed with surgical  intervention. All risks, benefits, and alternatives discussed with patient. No guarantees given. Consent reviewed and signed by patient. Post-op course explained at length. -Planned procedures: Chevron osteotomy with possible phalangeal osteotomy -Risk factors: None -I explained to the patient my preoperative intra or postoperative plan in extensive detail.  At this time patient does not have any sesamoidal pain and primarily at the medial eminence/hypertrophy of the bone.  Given the pain and in setting of failing all conservative care patient will benefit from surgical intervention listed above.  She agrees with the plan would like to proceed with surgery -Informed surgical risk consent was reviewed and read aloud to the patient.  I reviewed the films.  I have discussed my findings with the patient in great detail.  I have discussed all risks including but not limited to infection, stiffness, scarring, limp, disability, deformity, damage to blood vessels and nerves, numbness, poor healing, need for braces, arthritis, chronic pain, amputation, death.  All benefits and realistic expectations discussed in great detail.  I have made no promises as to the outcome.  I have provided realistic expectations.  I have offered the patient a 2nd opinion, which they have declined and assured me they preferred to proceed despite the risks   No follow-ups on file.

## 2023-10-17 ENCOUNTER — Telehealth: Payer: Self-pay | Admitting: Urology

## 2023-10-17 NOTE — Telephone Encounter (Signed)
Received Sx consent from our [place] location, I called pt and LM to call back when she is ready to schedule her sx with Dr. Allena Katz.

## 2023-11-04 ENCOUNTER — Telehealth: Payer: Self-pay | Admitting: Podiatry

## 2023-11-04 NOTE — Telephone Encounter (Signed)
Pt called stating that she would like to schedule her surgery with Dr.Patel, she has already signed her consent paperwork.

## 2023-12-07 ENCOUNTER — Telehealth: Payer: Self-pay | Admitting: Podiatry

## 2023-12-07 NOTE — Telephone Encounter (Signed)
DOS-01/02/24  Serafina Royals [id number] DOUBLE OSTEOTOMY [id number]  [employer] EFFECTIVE DATE-10/16/23  DEDUCTIBLE- $3500.00 WITH REMAINING $3500.00 OOP-$7000.00 WITH REMAINING $6917.26 COINSURANCE- 50%   PER THE [employer]ELON [employer]AL, PRIOR AUTH HAS BEEN APPROVED FOR CPT CODES 64403 AND [phone]. GOOD FROM 01/02/24 - 03/01/24.  AUTH Order ID: [id number]

## 2024-01-02 ENCOUNTER — Other Ambulatory Visit: Payer: Self-pay | Admitting: Podiatry

## 2024-01-02 MED ORDER — IBUPROFEN 800 MG PO TABS: 800.0000 mg | ORAL_TABLET | Freq: Four times a day (QID) | ORAL | 1 refills | Status: AC | PRN

## 2024-01-02 MED ORDER — OXYCODONE-ACETAMINOPHEN 5-325 MG PO TABS: 1.0000 | ORAL_TABLET | ORAL | 0 refills | Status: AC | PRN

## 2024-01-10 ENCOUNTER — Encounter: Payer: BC Managed Care – PPO | Admitting: Podiatry

## 2024-01-24 ENCOUNTER — Encounter: Payer: BC Managed Care – PPO | Admitting: Podiatry
